# Patient Record
Sex: Male | Born: 1940 | Race: White | Hispanic: No | Marital: Married | Smoking: Never smoker
Health system: Southern US, Community
[De-identification: ages and names within clinical notes are randomized; demographics above are authoritative.]

## PROBLEM LIST (undated history)

## (undated) DIAGNOSIS — G4733 Obstructive sleep apnea (adult) (pediatric): Secondary | ICD-10-CM

## (undated) DIAGNOSIS — I10 Essential (primary) hypertension: Secondary | ICD-10-CM

## (undated) DIAGNOSIS — E119 Type 2 diabetes mellitus without complications: Secondary | ICD-10-CM

## (undated) DIAGNOSIS — R011 Cardiac murmur, unspecified: Secondary | ICD-10-CM

## (undated) DIAGNOSIS — I639 Cerebral infarction, unspecified: Secondary | ICD-10-CM

## (undated) HISTORY — DX: Obstructive sleep apnea (adult) (pediatric): G47.33

## (undated) HISTORY — DX: Cerebral infarction, unspecified: I63.9

## (undated) HISTORY — DX: Essential (primary) hypertension: I10

## (undated) HISTORY — DX: Cardiac murmur, unspecified: R01.1

## (undated) HISTORY — DX: Type 2 diabetes mellitus without complications: E11.9

---

## 2004-01-15 LAB — HM COLONOSCOPY: HM Colonoscopy: NORMAL

## 2006-09-16 ENCOUNTER — Encounter: Payer: Self-pay | Admitting: Family Medicine

## 2006-09-16 LAB — CONVERTED CEMR LAB
Cholesterol: 162 mg/dL
LDL Cholesterol: 107 mg/dL
Triglycerides: 66 mg/dL

## 2007-03-23 ENCOUNTER — Ambulatory Visit: Payer: Self-pay | Admitting: Family Medicine

## 2007-03-23 DIAGNOSIS — I10 Essential (primary) hypertension: Secondary | ICD-10-CM

## 2007-03-23 DIAGNOSIS — E785 Hyperlipidemia, unspecified: Secondary | ICD-10-CM | POA: Insufficient documentation

## 2007-03-23 DIAGNOSIS — I6350 Cerebral infarction due to unspecified occlusion or stenosis of unspecified cerebral artery: Secondary | ICD-10-CM | POA: Insufficient documentation

## 2007-03-26 ENCOUNTER — Encounter: Payer: Self-pay | Admitting: Family Medicine

## 2007-03-30 ENCOUNTER — Encounter: Payer: Self-pay | Admitting: Family Medicine

## 2007-03-30 LAB — CONVERTED CEMR LAB
Albumin: 4.4 g/dL (ref 3.5–5.2)
Alkaline Phosphatase: 59 units/L (ref 39–117)
Calcium: 9.2 mg/dL (ref 8.4–10.5)
Cholesterol, target level: 200 mg/dL
Creatinine, Ser: 1.02 mg/dL (ref 0.40–1.50)
Glucose, Bld: 106 mg/dL — ABNORMAL HIGH (ref 70–99)
HDL goal, serum: 40 mg/dL
HDL: 41 mg/dL (ref >39)
PSA: 3.26 ng/mL (ref 0.10–4.00)
Sodium: 140 meq/L (ref 135–145)
Total Protein: 6.9 g/dL (ref 6.0–8.3)
VLDL: 14 mg/dL (ref 0–40)

## 2007-04-07 ENCOUNTER — Telehealth: Payer: Self-pay | Admitting: Family Medicine

## 2007-04-23 ENCOUNTER — Telehealth: Payer: Self-pay | Admitting: Family Medicine

## 2007-04-27 ENCOUNTER — Encounter: Payer: Self-pay | Admitting: Family Medicine

## 2007-04-27 DIAGNOSIS — G4733 Obstructive sleep apnea (adult) (pediatric): Secondary | ICD-10-CM

## 2007-04-27 DIAGNOSIS — E119 Type 2 diabetes mellitus without complications: Secondary | ICD-10-CM | POA: Insufficient documentation

## 2007-04-28 ENCOUNTER — Ambulatory Visit: Payer: Self-pay | Admitting: Family Medicine

## 2007-05-14 ENCOUNTER — Encounter: Payer: Self-pay | Admitting: Family Medicine

## 2007-06-26 ENCOUNTER — Ambulatory Visit: Payer: Self-pay | Admitting: Family Medicine

## 2007-07-20 ENCOUNTER — Telehealth: Payer: Self-pay | Admitting: Family Medicine

## 2007-09-24 ENCOUNTER — Telehealth: Payer: Self-pay | Admitting: Family Medicine

## 2007-09-28 ENCOUNTER — Encounter: Payer: Self-pay | Admitting: Family Medicine

## 2007-09-29 LAB — CONVERTED CEMR LAB
Alkaline Phosphatase: 50 units/L (ref 39–117)
BUN: 17 mg/dL (ref 6–23)
CO2: 26 meq/L (ref 19–32)
Calcium: 10 mg/dL (ref 8.4–10.5)
Cholesterol: 108 mg/dL (ref 0–200)
Glucose, Bld: 115 mg/dL — ABNORMAL HIGH (ref 70–99)
HDL: 39 mg/dL — ABNORMAL LOW (ref >39)
LDL Cholesterol: 49 mg/dL (ref 0–99)
Potassium: 4.5 meq/L (ref 3.5–5.3)
Total CHOL/HDL Ratio: 2.8

## 2007-10-28 ENCOUNTER — Ambulatory Visit: Payer: Self-pay | Admitting: Family Medicine

## 2007-10-28 DIAGNOSIS — L909 Atrophic disorder of skin, unspecified: Secondary | ICD-10-CM | POA: Insufficient documentation

## 2007-10-28 DIAGNOSIS — L919 Hypertrophic disorder of the skin, unspecified: Secondary | ICD-10-CM

## 2007-10-28 DIAGNOSIS — R350 Frequency of micturition: Secondary | ICD-10-CM

## 2007-10-29 ENCOUNTER — Telehealth: Payer: Self-pay | Admitting: Family Medicine

## 2008-04-07 ENCOUNTER — Encounter: Payer: Self-pay | Admitting: Family Medicine

## 2008-04-11 LAB — CONVERTED CEMR LAB
ALT: 13 units/L (ref 0–53)
AST: 14 units/L (ref 0–37)
Albumin: 4.3 g/dL (ref 3.5–5.2)
BUN: 17 mg/dL (ref 6–23)
Cholesterol: 97 mg/dL (ref 0–200)
Creatinine, Ser: 1.05 mg/dL (ref 0.40–1.50)
Glucose, Bld: 106 mg/dL — ABNORMAL HIGH (ref 70–99)
LDL Cholesterol: 42 mg/dL (ref 0–99)
Potassium: 4.8 meq/L (ref 3.5–5.3)
Total CHOL/HDL Ratio: 2.4
Total Protein: 7 g/dL (ref 6.0–8.3)
VLDL: 14 mg/dL (ref 0–40)

## 2008-04-12 ENCOUNTER — Ambulatory Visit: Payer: Self-pay | Admitting: Family Medicine

## 2008-05-06 ENCOUNTER — Telehealth (INDEPENDENT_AMBULATORY_CARE_PROVIDER_SITE_OTHER): Payer: Self-pay | Admitting: *Deleted

## 2008-06-01 ENCOUNTER — Ambulatory Visit: Payer: Self-pay | Admitting: Family Medicine

## 2008-06-01 LAB — CONVERTED CEMR LAB: Blood Glucose, AC Bkfst: 125 mg/dL

## 2008-06-06 ENCOUNTER — Telehealth: Payer: Self-pay | Admitting: Family Medicine

## 2008-08-01 ENCOUNTER — Ambulatory Visit: Payer: Self-pay | Admitting: Family Medicine

## 2008-09-12 ENCOUNTER — Ambulatory Visit: Payer: Self-pay | Admitting: Family Medicine

## 2008-09-22 ENCOUNTER — Encounter: Payer: Self-pay | Admitting: Family Medicine

## 2008-11-25 ENCOUNTER — Ambulatory Visit: Payer: Self-pay | Admitting: Family Medicine

## 2008-12-21 ENCOUNTER — Ambulatory Visit: Payer: Self-pay | Admitting: Family Medicine

## 2008-12-21 LAB — CONVERTED CEMR LAB
Creatinine,U: 300 mg/dL
Microalbumin U total vol: 30 mg/L

## 2008-12-28 ENCOUNTER — Encounter: Payer: Self-pay | Admitting: Family Medicine

## 2009-04-21 ENCOUNTER — Encounter: Admission: RE | Admit: 2009-04-21 | Discharge: 2009-04-21 | Payer: Self-pay | Admitting: Family Medicine

## 2009-04-21 ENCOUNTER — Ambulatory Visit: Payer: Self-pay | Admitting: Family Medicine

## 2009-04-21 DIAGNOSIS — M25579 Pain in unspecified ankle and joints of unspecified foot: Secondary | ICD-10-CM

## 2009-04-21 LAB — CONVERTED CEMR LAB
Creatinine,U: 100 mg/dL
Hgb A1c MFr Bld: 5.8 %
Microalbumin U total vol: 10 mg/L

## 2009-04-25 ENCOUNTER — Encounter: Payer: Self-pay | Admitting: Family Medicine

## 2009-04-25 LAB — CONVERTED CEMR LAB
Albumin: 4.4 g/dL (ref 3.5–5.2)
CO2: 27 meq/L (ref 19–32)
Chloride: 104 meq/L (ref 96–112)
Glucose, Bld: 111 mg/dL — ABNORMAL HIGH (ref 70–99)
LDL Cholesterol: 54 mg/dL (ref 0–99)
PSA: 3.31 ng/mL (ref 0.10–4.00)
Potassium: 5 meq/L (ref 3.5–5.3)
Total CHOL/HDL Ratio: 2.5
VLDL: 12 mg/dL (ref 0–40)

## 2009-05-22 ENCOUNTER — Ambulatory Visit: Payer: Self-pay | Admitting: Family Medicine

## 2009-06-23 ENCOUNTER — Ambulatory Visit: Payer: Self-pay | Admitting: Family Medicine

## 2009-08-31 ENCOUNTER — Ambulatory Visit: Payer: Self-pay | Admitting: Family Medicine

## 2009-10-01 LAB — HM DIABETES FOOT EXAM

## 2010-01-01 ENCOUNTER — Ambulatory Visit: Payer: Self-pay | Admitting: Family Medicine

## 2010-01-01 ENCOUNTER — Encounter: Payer: Self-pay | Admitting: Family Medicine

## 2010-01-01 ENCOUNTER — Telehealth: Payer: Self-pay | Admitting: Family Medicine

## 2010-01-01 LAB — CONVERTED CEMR LAB: Hgb A1c MFr Bld: 5.6 %

## 2010-01-02 LAB — CONVERTED CEMR LAB
BUN: 18 mg/dL (ref 6–23)
CO2: 27 meq/L (ref 19–32)
Chloride: 105 meq/L (ref 96–112)
Creatinine, Ser: 1.13 mg/dL (ref 0.40–1.50)
Glucose, Bld: 100 mg/dL — ABNORMAL HIGH (ref 70–99)
Potassium: 4.3 meq/L (ref 3.5–5.3)

## 2010-02-13 NOTE — Assessment & Plan Note (Signed)
Summary: f/u BP   Vital Signs:  Patient profile:   70 year old male Height:      67.1 inches Weight:      175 pounds BMI:     27.43 O2 Sat:      98 % on Room air Pulse rate:   67 / minute BP sitting:   127 / 71  (left arm) Cuff size:   regular  Vitals Entered By: Payton Spark CMA (June 23, 2009 10:18 AM)  O2 Flow:  Room air CC: F/U HTN   Primary Care Provider:  Nani Gasser MD  CC:  F/U HTN.  History of Present Illness: 70 yo WM presents for HTN f/u.  Saw Dr Linford Arnold 1 mo ago and BP meds changed to [employer]. Tolerating it well.  Labs done in April.  Denies CP, leg edema, SOB or palpitations.    Allergies: No Known Drug Allergies  Past History:  Past Medical History: Stroke in 2002 Heart Murmur w/ LVH OSA not using CPAP HTN  Past Surgical History: Reviewed history from 03/23/2007 and no changes required. None  Social History: Reviewed history from 04/28/2007 and no changes required. 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 Denies any long lasting effects from his stroke.  HS degree. married to [employer].  One daughter.  Retired Alcohol use-no Drug use-no Regular exercise-no Never Smoked  Review of Systems      See HPI  Physical Exam  General:  alert, well-developed, well-nourished, and well-hydrated.   Mouth:  pharynx pink and moist.   Neck:  no masses.   Lungs:  CTA bilat w/o W/R/R Heart:  RRR with 2/6 SEM over apex Extremities:  trace LE pitting edema bilat Skin:  color normal.     Impression & Recommendations:  Problem # 1:  ESSENTIAL HYPERTENSION, BENIGN (ICD-401.1) BP much improved. Continue Tribenzor.  1 mo of samples given + RX thru [facility]. F/U with Dr Eppie Gibson in 2 mos for DM and HTN. His updated medication list for this problem includes:    Tribenzor 40-5-25 Mg Tabs (Olmesartan-amlodipine-hctz) .Marland Kitchen... Take 1 tablet by mouth once a day in the am  BP today: 127/71 Prior BP: 149/73 (05/22/2009)  Prior 10 Yr Risk Heart Disease: 22 %  (04/21/2009)  Labs Reviewed: K+: 5.0 (04/25/2009) Creat: : 1.13 (04/25/2009)   Chol: 110 (04/25/2009)   HDL: 44 (04/25/2009)   LDL: 54 (04/25/2009)   TG: 60 (04/25/2009)  Complete Medication List: 1)  Simvastatin 80 Mg Tabs (Simvastatin) .... Take 1 tablet by mouth once a day at bedtime 2)  Aspirin 81 Mg Tbec (Aspirin) .... Take 1 tablet by mouth once a day 3)  Lumigan 0.03 % Soln (Bimatoprost) .... One drop to both eyes daily 4)  Brimonidine Tartrate 0.2 % Soln (Brimonidine tartrate) 5)  Metformin Hcl 500 Mg Tabs (Metformin hcl) .... Take 1 tablet by mouth once a day in the am. 6)  Glucometer  .... With 100 strips and lancets. 7)  Tribenzor 40-5-25 Mg Tabs (Olmesartan-amlodipine-hctz) .... Take 1 tablet by mouth once a day in the am  Patient Instructions: 1)  Stay on Tribenzor. 2)  BP looks great! 3)  RX was sent to [facility]. 4)  Return to see Dr Linford Arnold in 2 mos for BP and Diabetes. Prescriptions: TRIBENZOR 40-5-25 MG TABS (OLMESARTAN-AMLODIPINE-HCTZ) Take 1 tablet by mouth once a day in the AM  #90 x 1   Entered and Authorized by:   Seymour Bars DO   Signed by:   Seymour Bars DO  on 06/23/2009   Method used:   Electronically to        [facility]* (mail-order)             ,          Ph: [phone]       Fax: [phone]   RxID:   [id number]

## 2010-02-13 NOTE — Assessment & Plan Note (Signed)
Summary: 1 mo. f/u HTN   Vital Signs:  Patient profile:   70 year old male Height:      67.1 inches Weight:      177 pounds Pulse rate:   59 / minute BP sitting:   149 / 73  (left arm) Cuff size:   regular  Vitals Entered By: Kathlene November (May 22, 2009 10:23 AM) CC: recheck BP, Hypertension Management   Primary Care Provider:  Linford Arnold, C  CC:  recheck BP and Hypertension Management.  History of Present Illness: recheck BP, Hypertension Management  Hypertension History:      He denies headache, chest pain, palpitations, dyspnea with exertion, orthopnea, PND, peripheral edema, visual symptoms, neurologic problems, syncope, and side effects from treatment.  He notes no problems with any antihypertensive medication side effects.  Says he is taking a whole pill.  Marland Kitchen        Positive major cardiovascular risk factors include male age 37 years old or older, diabetes, hyperlipidemia, and hypertension.  Negative major cardiovascular risk factors include negative family history for ischemic heart disease and non-tobacco-user status.        Positive history for target organ damage include prior stroke (or TIA).  Further assessment for target organ damage reveals no history of ASHD or peripheral vascular disease.      Current Medications (verified): 1)  Lisinopril 40 Mg  Tabs (Lisinopril) .... Take 1 Tablet By Mouth Once A Day 2)  Simvastatin 80 Mg  Tabs (Simvastatin) .... Take 1 Tablet By Mouth Once A Day At Bedtime 3)  Aspirin 81 Mg  Tbec (Aspirin) .... Take 1 Tablet By Mouth Once A Day 4)  Lumigan 0.03 %  Soln (Bimatoprost) .... One Drop To Both Eyes Daily 5)  Hydrochlorothiazide 25 Mg Tabs (Hydrochlorothiazide) .... Take 1 Tablet By Mouth Once A Day 6)  Brimonidine Tartrate 0.2 %  Soln (Brimonidine Tartrate) 7)  Metformin Hcl 500 Mg Tabs (Metformin Hcl) .... Take 1 Tablet By Mouth Once A Day in The Am. 8)  Glucometer .... With 100 Strips and Lancets.  Allergies (verified): No Known  Drug Allergies  Physical Exam  General:  Well-developed,well-nourished,in no acute distress; alert,appropriate and cooperative throughout examination Head:  Normocephalic and atraumatic without obvious abnormalities. No apparent alopecia or balding. Eyes:  No corneal or conjunctival inflammation noted. EOMI. Perrla.  Lungs:  Normal respiratory effort, chest expands symmetrically. Lungs are clear to auscultation, no crackles or wheezes. Heart:  Normal rate and regular rhythm. S1 and S2 normal without gallop, click, rub or other extra sounds. 2/6 SEM best heard on teh right side of the chest.  Skin:  no rashes.   Cervical Nodes:  No lymphadenopathy noted Psych:  Cognition and judgment appear intact. Alert and cooperative with normal attention span and concentration. No apparent delusions, illusions, hallucinations   Impression & Recommendations:  Problem # 1:  ESSENTIAL HYPERTENSION, BENIGN (ICD-401.1) Still elevated . Change to TGribenzor. If too expensive then will go back to the lisinopril, HCTZ, adn add amlodipine separately.   F/U in one month for BP check.  The following medications were removed from the medication list:    Lisinopril 40 Mg Tabs (Lisinopril) .Marland Kitchen... Take 1 tablet by mouth once a day    Hydrochlorothiazide 25 Mg Tabs (Hydrochlorothiazide) .Marland Kitchen... Take 1 tablet by mouth once a day His updated medication list for this problem includes:    Tribenzor 40-5-25 Mg Tabs (Olmesartan-amlodipine-hctz) .Marland Kitchen... Take 1 tablet by mouth once a day in the  am  Complete Medication List: 1)  Simvastatin 80 Mg Tabs (Simvastatin) .... Take 1 tablet by mouth once a day at bedtime 2)  Aspirin 81 Mg Tbec (Aspirin) .... Take 1 tablet by mouth once a day 3)  Lumigan 0.03 % Soln (Bimatoprost) .... One drop to both eyes daily 4)  Brimonidine Tartrate 0.2 % Soln (Brimonidine tartrate) 5)  Metformin Hcl 500 Mg Tabs (Metformin hcl) .... Take 1 tablet by mouth once a day in the am. 6)  Glucometer  ....  With 100 strips and lancets. 7)  Tribenzor 40-5-25 Mg Tabs (Olmesartan-amlodipine-hctz) .... Take 1 tablet by mouth once a day in the am  Hypertension Assessment/Plan:      The patient's hypertensive risk group is category C: Target organ damage and/or diabetes.  His calculated 10 year risk of coronary heart disease is 22 %.  Today's blood pressure is 149/73.  His blood pressure goal is < 140/90.  Patient Instructions: 1)  Will try Tribenzor once a day (in place of your lisinopril and the HCTZ).  If too expensive then let me know and will restart the lisinopril, HCTZ and will add amlodipine.   2)  Please schedule a follow-up appointment in 1 month to recheck your blood pressure.  Prescriptions: TRIBENZOR 40-5-25 MG TABS (OLMESARTAN-AMLODIPINE-HCTZ) Take 1 tablet by mouth once a day in the AM  #30 x 0   Entered and Authorized by:   Nani Gasser MD   Signed by:   Nani Gasser MD on 05/22/2009   Method used:   Electronically to        [facility] [phone]* (retail)       [address] [place], [place]  [address]       Ph: [phone]       Fax: [phone]   RxID:   [id number] SIMVASTATIN 80 MG  TABS (SIMVASTATIN) Take 1 tablet by mouth once a day at bedtime  #90 x 3   Entered by:   Kathlene November   Authorized by:   Nani Gasser MD   Signed by:   Nani Gasser MD on 05/22/2009   Method used:   Electronically to        [facility]* (mail-order)             ,          Ph: [phone]       Fax: [phone]   RxID:   [id number]   [employer] Next Due:  Not Indicated Hemoccult Next Due:  Not Indicated

## 2010-02-13 NOTE — Assessment & Plan Note (Signed)
Summary: 2 MONTH FU//VGJ   Vital Signs:  Patient profile:   70 year old male Height:      67.1 inches Weight:      178 pounds BMI:     27.90 O2 Sat:      99 % on Room air Pulse rate:   60 / minute BP sitting:   124 / 68  (left arm) Cuff size:   regular  Vitals Entered By: Payton Spark CMA (August 31, 2009 10:04 AM)  O2 Flow:  Room air CC: F/U   Primary Care Shetara Launer:  Nani Gasser MD  CC:  F/U.  History of Present Illness: 70 yo WM presents for f/u HTN, DM and memory testing s/p CVA.  He is well controlled with his DM.  Home AM fastings are in the 90s.  Fair control with diet, no exercise.  Eye exam, monofilament and microlabumin are UTD.  He has cutting his metformin in half.    His HTN has been well controlled on Tribenzor.  he has also been cutting this in half on his own.  Denies CP or DOE or leg swelling.    His wife was concerned about memory loss, so he returned for an updated memmory test today.  Allergies: No Known Drug Allergies  Past History:  Past Medical History: Stroke in 2002 Heart Murmur w/ LVH OSA not using CPAP HTN T2DM - well controlled  Social History: Reviewed history from 04/28/2007 and no changes required. 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 Denies any long lasting effects from his stroke.  HS degree. married to [employer].  One daughter.  Retired Alcohol use-no Drug use-no Regular exercise-no Never Smoked  Review of Systems      See HPI  Physical Exam  General:  alert, well-developed, and well-nourished.   Head:  normocephalic and atraumatic.   Eyes:  pupils equal, pupils round, and pupils reactive to light.   Mouth:  pharynx pink and moist.   Neck:  supple, no thyromegaly, and no carotid bruits.   Lungs:  Clear to auscultation bilaterally with no wheezes, crackles or rhonchi. Heart:  normal rate, regular rhythm, no murmur, no gallop, and no rub.   Extremities:  no LE edema Neurologic:  DTRs symmetrical and normal.     Skin:  color normal.   Cervical Nodes:  no anterior cervical adenopathy and no posterior cervical adenopathy.   Psych:  Oriented X3, good eye contact, and not anxious appearing.   Additional Exam:  Today, we performed a cognitive exam because of concern about memory.  On the Mini-Mental State Exam, he scored a 28 out of 30 with misses on copying designs and the numerical date.  Registration and recall of words were grossly intact.  He completed parts A and B the trails making test with times of 52 seconds and 83 seconds respectivelly both are which are well within normal limits.  He scored a 8 out of ten on the clock draw test and had some difficulty placing the numbers in their correct position on the clock.  This along with copying designs shows some evidence of difficulty of visuo-spaital recognition.    Diabetes Management Exam:    Foot Exam (with socks and/or shoes not present):       Sensory-Pinprick/Light touch:          Left medial foot (L-4): normal          Left dorsal foot (L-5): normal          Left  lateral foot (S-1): normal          Right medial foot (L-4): normal          Right dorsal foot (L-5): normal          Right lateral foot (S-1): normal       Sensory-Monofilament:          Left foot: normal          Right foot: normal       Inspection:          Left foot: normal          Right foot: normal   Impression & Recommendations:  Problem # 1:  DIABETES MELLITUS, TYPE II (ICD-250.00) Urine micro, eye exam, monofilament are UTD. A1C today:5.3 excellent.   Continue current meds.  Home sugars at goal : 90s fasting. RTC in 3 mos for f/u DM/ flu shot. His updated medication list for this problem includes:    Aspirin 81 Mg Tbec (Aspirin) .Marland Kitchen... Take 1 tablet by mouth once a day    Metformin Hcl 500 Mg Tabs (Metformin hcl) .Marland Kitchen... 1/2 tab by mouth daily    Tribenzor 40-5-25 Mg Tabs (Olmesartan-amlodipine-hctz) .Marland Kitchen... 1/2 tab by mouth once daily  Labs Reviewed: Creat: 1.13  (04/25/2009)   Microalbumin: 10 (04/21/2009) Reviewed HgBA1c results: 5.8 (04/21/2009)  5.3 (12/21/2008)  Problem # 2:  CVA (ICD-434.91) Memory testing done today. 28/30 MMSE. His updated medication list for this problem includes:    Aspirin 81 Mg Tbec (Aspirin) .Marland Kitchen... Take 1 tablet by mouth once a day  Problem # 3:  ESSENTIAL HYPERTENSION, BENIGN (ICD-401.1) BP looks great.  He's been cutting his Tribenzor in half.  Will continue.   His updated medication list for this problem includes:    Tribenzor 40-5-25 Mg Tabs (Olmesartan-amlodipine-hctz) .Marland Kitchen... 1/2 tab by mouth once daily  BP today: 124/68 Prior BP: 127/71 (06/23/2009)  Prior 10 Yr Risk Heart Disease: 22 % (04/21/2009)  Labs Reviewed: K+: 5.0 (04/25/2009) Creat: : 1.13 (04/25/2009)   Chol: 110 (04/25/2009)   HDL: 44 (04/25/2009)   LDL: 54 (04/25/2009)   TG: 60 (04/25/2009)  Problem # 4:  HYPERLIPIDEMIA (ICD-272.4) Labs done 4-11; at goal.  Continue current meds. His updated medication list for this problem includes:    Simvastatin 80 Mg Tabs (Simvastatin) .Marland Kitchen... 1/2 tab by mouth qhs  Labs Reviewed: SGOT: 14 (04/25/2009)   SGPT: 12 (04/25/2009)  Lipid Goals: Chol Goal: 200 (03/30/2007)   HDL Goal: 40 (03/30/2007)   LDL Goal: 100 (03/30/2007)   TG Goal: 150 (03/30/2007)  Prior 10 Yr Risk Heart Disease: 22 % (04/21/2009)   HDL:44 (04/25/2009), 41 (04/07/2008)  LDL:54 (04/25/2009), 42 (04/07/2008)  Chol:110 (04/25/2009), 97 (04/07/2008)  Trig:60 (04/25/2009), 68 (04/07/2008)  Complete Medication List: 1)  Simvastatin 80 Mg Tabs (Simvastatin) .... 1/2 tab by mouth qhs 2)  Aspirin 81 Mg Tbec (Aspirin) .... Take 1 tablet by mouth once a day 3)  Lumigan 0.03 % Soln (Bimatoprost) .... One drop to both eyes daily 4)  Brimonidine Tartrate 0.2 % Soln (Brimonidine tartrate) 5)  Metformin Hcl 500 Mg Tabs (Metformin hcl) .... 1/2 tab by mouth daily 6)  Glucometer  .... With 100 strips and lancets. 7)  Tribenzor 40-5-25 Mg Tabs  (Olmesartan-amlodipine-hctz) .... 1/2 tab by mouth once daily  Patient Instructions: 1)  REturn for a f/u with Dr Linford Arnold in 3 mos.  Will do flu shot at this visit.  Appended Document: 2 MONTH FU//VGJ  Laboratory Results  Blood Tests     HGBA1C: 5.3%   (Normal Range: Non-Diabetic - 3-6%   Control Diabetic - 6-8%)

## 2010-02-13 NOTE — Letter (Signed)
Summary: MMSE Form  MMSE Form   Imported By: Lanelle Bal 09/05/2009 10:51:27  _____________________________________________________________________  External Attachment:    Type:   Image     Comment:   External Document

## 2010-02-13 NOTE — Assessment & Plan Note (Signed)
Summary: 4 MONTH FUP HTN, left ankle pain   Vital Signs:  Patient profile:   70 year old male Height:      67.1 inches Weight:      176 pounds BMI:     27.58 Pulse rate:   61 / minute BP sitting:   148 / 72  (left arm) Cuff size:   regular  Vitals Entered By: Kathlene November (April 21, 2009 9:53 AM) CC: followup BP and diabetes, Hypertension Management Is Patient Diabetic? Yes   Primary Care Provider:  Linford Arnold, C  CC:  followup BP and diabetes and Hypertension Management.  History of Present Illness: followup BP and diabetes.  Left ankle pain, outside.  Feels it is weak.  No aggrevating or alleviating pain.  Bothers him daily. No swelling. No old injuries.  No giving or out locking. No hx of gout. Never takes any pain meds for it.  He is very nondescript about the pain.   Hypertension History:      He denies headache, chest pain, palpitations, dyspnea with exertion, orthopnea, PND, peripheral edema, visual symptoms, neurologic problems, syncope, and side effects from treatment.  He notes no problems with any antihypertensive medication side effects.  Has been taking half of .        Positive major cardiovascular risk factors include male age 60 years old or older, diabetes, hyperlipidemia, and hypertension.  Negative major cardiovascular risk factors include negative family history for ischemic heart disease and non-tobacco-user status.        Positive history for target organ damage include prior stroke (or TIA).  Further assessment for target organ damage reveals no history of ASHD or peripheral vascular disease.     Current Medications (verified): 1)  Lisinopril 40 Mg  Tabs (Lisinopril) .... Take 1 Tablet By Mouth Once A Day 2)  Simvastatin 80 Mg  Tabs (Simvastatin) .... Take 1 Tablet By Mouth Once A Day At Bedtime 3)  Aspirin 81 Mg  Tbec (Aspirin) .... Take 1 Tablet By Mouth Once A Day 4)  Lumigan 0.03 %  Soln (Bimatoprost) .... One Drop To Both Eyes Daily 5)  Hydrochlorothiazide  25 Mg Tabs (Hydrochlorothiazide) .... Take 1 Tablet By Mouth Once A Day 6)  Brimonidine Tartrate 0.2 %  Soln (Brimonidine Tartrate) 7)  Metformin Hcl 500 Mg Tabs (Metformin Hcl) .... Take 1 Tablet By Mouth Once A Day in The Am. 8)  Glucometer .... With 100 Strips and Lancets.  Allergies (verified): No Known Drug Allergies  Comments:  Nurse/Medical Assistant: The patient's medications and allergies were reviewed with the patient and were updated in the Medication and Allergy Lists. Kathlene November (April 21, 2009 9:54 AM)  Physical Exam  General:  Well-developed,well-nourished,in no acute distress; alert,appropriate and cooperative throughout examination Head:  Normocephalic and atraumatic without obvious abnormalities. No apparent alopecia or balding. Neck:  No deformities, masses, or tenderness noted. Lungs:  Normal respiratory effort, chest expands symmetrically. Lungs are clear to auscultation, no crackles or wheezes. Heart:  Normal rate and regular rhythm. S1 and S2 normal without gallop, murmur, click, rub or other extra sounds. No carotid brutis.  Msk:  Left ankel with no swelling or tenderness. No abnomralities. Some of the nails are slightly thickened.  Nrom and strength are 5/5 bilat.  Great toe strength 5/5/  Pulses:  DP and Post Tib are 2+.  Cap refill in teh great toe is normal.  Skin:  no rashes.   Cervical Nodes:  No lymphadenopathy noted Psych:  Cognition and judgment appear intact. Alert and cooperative with normal attention span and concentration. No apparent delusions, illusions, hallucinations   Impression & Recommendations:  Problem # 1:  DIABETES MELLITUS, TYPE II (ICD-250.00) Did go to the diabetes classes but his wife says he is not following the diet.  Doing well on the metformin. Continue current regimen.  His updated medication list for this problem includes:    Lisinopril 40 Mg Tabs (Lisinopril) .Marland Kitchen... Take 1 tablet by mouth once a day    Aspirin 81 Mg Tbec  (Aspirin) .Marland Kitchen... Take 1 tablet by mouth once a day    Metformin Hcl 500 Mg Tabs (Metformin hcl) .Marland Kitchen... Take 1 tablet by mouth once a day in the am.  Orders: Creatinine  ([id number]) Hgb A1C ([id number]) Urine Microalbumin (38756) T-Comprehensive Metabolic Panel ([id number]) T-Lipid Profile ([id number])  Problem # 2:  ESSENTIAL HYPERTENSION, BENIGN (ICD-401.1) Dsicussed importance of  taking the medications as prescribed. Needs to take a whol pill. Explained that he is at increased risk of recurrent stroke if hi BP is not well controlled.  His updated medication list for this problem includes:    Lisinopril 40 Mg Tabs (Lisinopril) .Marland Kitchen... Take 1 tablet by mouth once a day    Hydrochlorothiazide 25 Mg Tabs (Hydrochlorothiazide) .Marland Kitchen... Take 1 tablet by mouth once a day  BP today: 148/72 Prior BP: 147/73 (12/21/2008)  Prior 10 Yr Risk Heart Disease: 14 % (10/28/2007)  Labs Reviewed: K+: 4.8 (04/07/2008) Creat: : 1.05 (04/07/2008)   Chol: 97 (04/07/2008)   HDL: 41 (04/07/2008)   LDL: 42 (04/07/2008)   TG: 68 (04/07/2008)  Problem # 3:  ANKLE PAIN, LEFT (ICD-719.47) Exam is completley normal. Will get an xray to rule out fracture or arthritis.  Pain has persisted for 2 months. Can use an NSAID or Tyelnol for pain relief if needed but he says he won't really take anything for it.  Call if gets worse.   Orders: T-DG Ankle 2 Views *L* ([id number])  Complete Medication List: 1)  Lisinopril 40 Mg Tabs (Lisinopril) .... Take 1 tablet by mouth once a day 2)  Simvastatin 80 Mg Tabs (Simvastatin) .... Take 1 tablet by mouth once a day at bedtime 3)  Aspirin 81 Mg Tbec (Aspirin) .... Take 1 tablet by mouth once a day 4)  Lumigan 0.03 % Soln (Bimatoprost) .... One drop to both eyes daily 5)  Hydrochlorothiazide 25 Mg Tabs (Hydrochlorothiazide) .... Take 1 tablet by mouth once a day 6)  Brimonidine Tartrate 0.2 % Soln (Brimonidine tartrate) 7)  Metformin Hcl 500 Mg Tabs (Metformin hcl) .... Take 1 tablet by mouth  once a day in the am. 8)  Glucometer  .... With 100 strips and lancets.  Other Orders: T-PSA ([id number])  Hypertension Assessment/Plan:      The patient's hypertensive risk group is category C: Target organ damage and/or diabetes.  His calculated 10 year risk of coronary heart disease is 22 %.  Today's blood pressure is 148/72.  His blood pressure goal is < 140/90.  Patient Instructions: 1)  Please schedule a follow-up appointment in 1 month for blood pressure.  Prescriptions: METFORMIN HCL 500 MG TABS (METFORMIN HCL) Take 1 tablet by mouth once a day in the AM.  #90 x 3   Entered and Authorized by:   Nani Gasser MD   Signed by:   Nani Gasser MD on 04/21/2009   Method used:   Electronically to        [facility]* (mail-order)             ,  Ph: [phone]       Fax: [phone]   RxID:   [id number] HYDROCHLOROTHIAZIDE 25 MG TABS (HYDROCHLOROTHIAZIDE) Take 1 tablet by mouth once a day  #90 x 3   Entered and Authorized by:   Nani Gasser MD   Signed by:   Nani Gasser MD on 04/21/2009   Method used:   Electronically to        [facility]* (mail-order)             ,          Ph: [phone]       Fax: [phone]   RxID:   [id number]   Laboratory Results   Urine Tests  Date/Time Received: 04/21/2009 Date/Time Reported: 04/21/2009  Microalbumin (urine): 10 mg/L Creatinine: 100mg /dL  A:C Ratio 30mg /g  Blood Tests   Date/Time Received: 04/21/2009 Date/Time Reported: 04/21/2009  HGBA1C: 5.8%   (Normal Range: Non-Diabetic - 3-6%   Control Diabetic - 6-8%)

## 2010-02-15 NOTE — Letter (Signed)
Summary: [facility]   Imported By: Lanelle Bal 01/22/2010 11:16:57  _____________________________________________________________________  External Attachment:    Type:   Image     Comment:   External Document

## 2010-02-15 NOTE — Assessment & Plan Note (Signed)
Summary: F/U DM, HTN   Vital Signs:  Patient profile:   70 year old male Height:      67.1 inches Weight:      184 pounds BMI:     28.84 Pulse rate:   66 / minute BP sitting:   156 / 74  (right arm) Cuff size:   regular  Vitals Entered By: Avon Gully CMA, Duncan Dull) (January 01, 2010 10:15 AM) CC: f/u DM,gave handout about shingles vaccine   Primary Care Provider:  Nani Gasser MD  CC:  f/u DM and gave handout about shingles vaccine.  History of Present Illness: f/u DM,gave handout about shingles .    Can't afford the Tribenzor. atypicals $200 per prescription.  He is already called and his mail-order company and gotten a new prescription for heart or thiazide.  He needs a new prescription for the ACE inhibitor and amlodipine.  He says he does have enough try been sore for about one more month.  No chest pain or shortness of breath.  He is also here to follow-up his diabetes.  His sugars have mostly been running in the 80s and he checks them.  He denies any lows.   Diabetes Management History:      The patient is a 70 years old male who comes in for evaluation of DM Type 2.  He states understanding of dietary principles and is following his diet appropriately.  He says that he is exercising.  Type of exercise includes: walking.  Duration of exercise is estimated to be 15 min.  He is doing this 4 times per week.        Hypoglycemic symptoms are not occurring.  No hyperglycemic symptoms are reported.  Other comments include: Sugars have been well  controlled. Has gained about 6 lbs. .        There are no symptoms to suggest diabetic complications.  No changes have been made to his treatment plan since last visit.    Current Medications (verified): 1)  Simvastatin 80 Mg  Tabs (Simvastatin) .... 1/2 Tab By Mouth Qhs 2)  Aspirin 81 Mg  Tbec (Aspirin) .... Take 1 Tablet By Mouth Once A Day 3)  Lumigan 0.03 %  Soln (Bimatoprost) .... One Drop To Both Eyes Daily 4)  Brimonidine  Tartrate 0.2 %  Soln (Brimonidine Tartrate) 5)  Metformin Hcl 500 Mg Tabs (Metformin Hcl) .... 1/2 Tab By Mouth Daily 6)  Glucometer .... With 100 Strips and Lancets. 7)  Tribenzor 40-5-25 Mg Tabs (Olmesartan-Amlodipine-Hctz) .... 1/2 Tab By Mouth Once Daily  Allergies (verified): No Known Drug Allergies  Comments:  Nurse/Medical Assistant: The patient's medications and allergies were reviewed with the patient and were updated in the Medication and Allergy Lists. Avon Gully CMA, Duncan Dull) (January 01, 2010 10:16 AM)  Physical Exam  General:  Well-developed,well-nourished,in no acute distress; alert,appropriate and cooperative throughout examination Lungs:  Normal respiratory effort, chest expands symmetrically. Lungs are clear to auscultation, no crackles or wheezes. Heart:  Normal rate and regular rhythm. S1 and S2 normal without gallop, murmur, click, rub or other extra sounds. Skin:  no rashes.   Psych:  Cognition and judgment appear intact. Alert and cooperative with normal attention span and concentration. No apparent delusions, illusions, hallucinations   Impression & Recommendations:  Problem # 1:  DIABETES MELLITUS, TYPE II (ICD-250.00) Discussed ne to lose 10 -15 lbs.  regular exercise would improve this.  We can also split out his Tribenzor  for cost reasons. his A1c  looks fantastic today.  I watched him stop his metformin.  Follow-up in 3 months to make sure he is at goal. I do not have an eye exam on file for him.  He says he did get one this year so we will call [facility]  associates. The following medications were removed from the medication list:    Metformin Hcl 500 Mg Tabs (Metformin hcl) .Marland Kitchen... 1/2 tab by mouth daily His updated medication list for this problem includes:    Aspirin 81 Mg Tbec (Aspirin) .Marland Kitchen... Take 1 tablet by mouth once a day    Lisinopril 40 Mg Tabs (Lisinopril) .Marland Kitchen... Take 1 tablet by mouth once a day  Orders: Fingerstick (16109) Hgb A1C  (60454UJ) T-Basic Metabolic Panel (81191-47829)  Problem # 2:  DIABETES MELLITUS, TYPE II (ICD-250.00) Tribenzor  is too expensive.  Change to hydrocortisone, lisinopril, amlodipine. The following medications were removed from the medication list:    Metformin Hcl 500 Mg Tabs (Metformin hcl) .Marland Kitchen... 1/2 tab by mouth daily His updated medication list for this problem includes:    Aspirin 81 Mg Tbec (Aspirin) .Marland Kitchen... Take 1 tablet by mouth once a day    Lisinopril 40 Mg Tabs (Lisinopril) .Marland Kitchen... Take 1 tablet by mouth once a day  Orders: Fingerstick (36416) Hgb A1C (56213YQ) T-Basic Metabolic Panel (65784-69629)  Complete Medication List: 1)  Simvastatin 80 Mg Tabs (Simvastatin) .... 1/2 tab by mouth qhs 2)  Aspirin 81 Mg Tbec (Aspirin) .... Take 1 tablet by mouth once a day 3)  Lumigan 0.03 % Soln (Bimatoprost) .... One drop to both eyes daily 4)  Brimonidine Tartrate 0.2 % Soln (Brimonidine tartrate) 5)  Glucometer  .... With 100 strips and lancets. 6)  Hydrochlorothiazide 25 Mg Tabs (Hydrochlorothiazide) .... Take 1 tablet by mouth once a day 7)  Lisinopril 40 Mg Tabs (Lisinopril) .... Take 1 tablet by mouth once a day 8)  Amlodipine Besylate 5 Mg Tabs (Amlodipine besylate) .... Take 1 tablet by mouth once a day 9)  Zostavax 52841 Unt/0.46ml Solr (Zoster vaccine live) .... Inject one dose im x 1  Other Orders: Influenza Vaccine MCR (32440)  Diabetes Management Assessment/Plan:      The following lipid goals have been established for the patient: Total cholesterol goal of 200; LDL cholesterol goal of 100; HDL cholesterol goal of 40; Triglyceride goal of 150.  His blood pressure goal is < 140/90.    Patient Instructions: 1)  Since can't afford Tribenzoe will need to take the 3 BP pills below. I having any low BPs at home then let me know.  2)  I do recommend hte shingles vaccines but have to get at a local pharmacy as it is covered under your Medicare Part D.  3)  Please schedule a  follow-up appointment in 3 months for Diabetes and Blood pressure.  4)  Lets stop the metformin and see how you do without it.   Prescriptions: ZOSTAVAX 10272 UNT/0.65ML SOLR (ZOSTER VACCINE LIVE) inject one dose IM x 1  #1 x 0   Entered and Authorized by:   Nani Gasser MD   Signed by:   Nani Gasser MD on 01/01/2010   Method used:   Print then Give to Patient   RxID:   [id number] AMLODIPINE BESYLATE 5 MG TABS (AMLODIPINE BESYLATE) Take 1 tablet by mouth once a day  #90 x 1   Entered and Authorized by:   Nani Gasser MD   Signed by:   Nani Gasser MD on  01/01/2010   Method used:   Electronically to        [facility]* (retail)             ,          Ph: [phone]       Fax: [phone]   RxID:   [id number] LISINOPRIL 40 MG TABS (LISINOPRIL) Take 1 tablet by mouth once a day  #90 x 1   Entered and Authorized by:   Nani Gasser MD   Signed by:   Nani Gasser MD on 01/01/2010   Method used:   Electronically to        [facility]* (retail)             ,          Ph: [phone]       Fax: [phone]   RxID:   [id number]    Orders Added: 1)  Fingerstick [36416] 2)  Hgb A1C [83036QW] 3)  Est. Patient Level III [75643] 4)  T-Basic Metabolic Panel [32951-88416] 5)  Influenza Vaccine MCR [00025] 6)  Est. Patient Level III [60630]   Immunizations Administered:  Influenza Vaccine # 1:    Vaccine Type: Fluvax MCR    Site: right deltoid    Mfr: Sanofi Pasteur    Dose: 0.5 ml    Route: IM    Given by: Sue Lush McCrimmon CMA, (AAMA)    Exp. Date: 06/15/2010    Lot #: [id number]    VIS given: 08/08/09 version given January 01, 2010.  Flu Vaccine Consent Questions:    Do you have a history of severe allergic reactions to this vaccine? no    Any prior history of allergic reactions to egg and/or gelatin? no    Do you have a sensitivity to the preservative Thimersol? no    Do you have a past history of Guillan-Barre  Syndrome? no    Do you currently have an acute febrile illness? no    Have you ever had a severe reaction to latex? no    Vaccine information given and explained to patient? no   Immunizations Administered:  Influenza Vaccine # 1:    Vaccine Type: Fluvax MCR    Site: right deltoid    Mfr: Sanofi Pasteur    Dose: 0.5 ml    Route: IM    Given by: Sue Lush McCrimmon CMA, (AAMA)    Exp. Date: 06/15/2010    Lot #: [id number]    VIS given: 08/08/09 version given January 01, 2010.  Laboratory Results   Blood Tests   Date/Time Received: 01/01/10 Date/Time Reported: 01/01/10  HGBA1C: 5.6%   (Normal Range: Non-Diabetic - 3-6%   Control Diabetic - 6-8%)         Appended Document: F/U DM, HTN   Diabetes Management History:      The patient is a 70 years old male who comes in for evaluation of DM Type 2.  He says that he is exercising.  Type of exercise includes: walking.  Duration of exercise is estimated to be 15 min.  He is doing this 4 times per week.    Diabetes Management Exam:    Eye Exam:       Eye Exam done elsewhere          Date: 09/22/2009          Results: normal          Done by: Karen Kitchens   Diabetes Management Assessment/Plan:  The following lipid goals have been established for the patient: Total cholesterol goal of 200; LDL cholesterol goal of 100; HDL cholesterol goal of 40; Triglyceride goal of 150.  His blood pressure goal is < 140/90.

## 2010-02-15 NOTE — Progress Notes (Signed)
Summary: Did not get shingles vaccine- too expensive  Phone Note Call from Patient Call back at Home Phone [phone]   Caller: Patient Call For: Nani Gasser MD Summary of Call: Pt says [facility] was gonna charge him 40.00 dollars for the shot so he did not get it. Initial call taken by: Kathlene November LPN,  January 01, 2010 11:58 AM

## 2010-02-20 ENCOUNTER — Encounter: Payer: Self-pay | Admitting: Family Medicine

## 2010-03-30 ENCOUNTER — Encounter: Payer: Self-pay | Admitting: Family Medicine

## 2010-04-03 ENCOUNTER — Encounter: Payer: Self-pay | Admitting: Family Medicine

## 2010-04-03 ENCOUNTER — Ambulatory Visit (INDEPENDENT_AMBULATORY_CARE_PROVIDER_SITE_OTHER): Payer: Medicare Other | Admitting: Family Medicine

## 2010-04-03 DIAGNOSIS — E119 Type 2 diabetes mellitus without complications: Secondary | ICD-10-CM

## 2010-04-03 DIAGNOSIS — I10 Essential (primary) hypertension: Secondary | ICD-10-CM

## 2010-04-03 LAB — POCT GLYCOSYLATED HEMOGLOBIN (HGB A1C): Hemoglobin A1C: 5.5

## 2010-04-03 NOTE — Progress Notes (Signed)
  Subjective:    Patient ID: Derrick Cowan, male    DOB: 12-Feb-1940, 70 y.o.   MRN: [id number]  Diabetes He presents for his initial diabetic visit. He has type 2 diabetes mellitus. There are no hypoglycemic associated symptoms. Pertinent negatives for hypoglycemia include no headaches. Pertinent negatives for diabetes include no blurred vision, no chest pain, no foot ulcerations, no polydipsia and no visual change. Symptoms are stable. Current diabetic treatment includes diet. He is compliant with treatment all of the time. His weight is stable. He is following a generally healthy diet. He participates in exercise daily (walking). He monitors urine at home 3+ x per week. His breakfast blood glucose range is generally 90-110 mg/dl. Eye exam current: hAS  eye exam sched tomorrow.  Hypertension This is a chronic problem. The problem is controlled. Pertinent negatives include no blurred vision, chest pain, headaches or shortness of breath. Risk factors for coronary artery disease include diabetes mellitus and male gender. Past treatments include ACE inhibitors. Compliance problems include diet.       Review of Systems  Eyes: Negative for blurred vision.  Respiratory: Negative for shortness of breath.   Cardiovascular: Negative for chest pain.  Neurological: Negative for headaches.  Hematological: Negative for polydipsia.       Objective:   Physical Exam  Constitutional: He appears well-developed.  HENT:  Head: Normocephalic.  Neck: Neck supple. No thyromegaly present.  Cardiovascular: Normal rate and regular rhythm.   Murmur heard.      2/6 SEM  Pulmonary/Chest: Effort normal and breath sounds normal.  Lymphadenopathy:    He has no cervical adenopathy.  Psychiatric: He has a normal mood and affect.          Assessment & Plan:  !. DM - A1C at goal today. Getting eye exam tomorrow. Due for lipids April. He is at goal and is diet controlled. He could really be downgraded to insulin  resistance. 2. HTN- At goal. Looks great today. F/U in 6 months.

## 2010-04-24 ENCOUNTER — Telehealth: Payer: Self-pay | Admitting: Family Medicine

## 2010-04-24 LAB — LIPID PANEL
HDL: 45 mg/dL (ref >39)
LDL Cholesterol: 55 mg/dL (ref 0–99)
Total CHOL/HDL Ratio: 2.5 Ratio

## 2010-04-24 MED ORDER — SIMVASTATIN 20 MG PO TABS: 20.0000 mg | ORAL_TABLET | Freq: Every day | ORAL | Status: DC

## 2010-04-24 NOTE — Telephone Encounter (Signed)
Call pt: Choleserol looks great. It look so goo we can probably drop your simvastatin to 20mg  and then recheck level in 6 month.  When run out of simvastatin don't get refills. Call here and we can send in teh new rx for 20mg  tabs.

## 2010-04-24 NOTE — Telephone Encounter (Signed)
Pt.notified

## 2010-05-14 ENCOUNTER — Telehealth: Payer: Self-pay | Admitting: Family Medicine

## 2010-05-14 DIAGNOSIS — E785 Hyperlipidemia, unspecified: Secondary | ICD-10-CM

## 2010-05-14 MED ORDER — SIMVASTATIN 20 MG PO TABS: 20.0000 mg | ORAL_TABLET | Freq: Every day | ORAL | Status: DC

## 2010-05-14 NOTE — Telephone Encounter (Signed)
Pt called into triage nurse and is requesting simvastatin 80 mg dose to be lowered.  Pt stated provider told will lower the dose.  Wants sent to [facility] for mail order.  Doesn't need supply to local pharm since still has 80 mg dose on hand. PLAN:  Chart reviewed and Simvastatin 20 mg ordered as lowering dose by provider.  Sent new Rx for Simvastatin 20 mg, #90/1 RF sent to [facility]. Jarvis Newcomer, LPN [facility]

## 2010-07-24 ENCOUNTER — Other Ambulatory Visit: Payer: Self-pay | Admitting: Family Medicine

## 2010-08-20 ENCOUNTER — Other Ambulatory Visit: Payer: Self-pay | Admitting: Family Medicine

## 2010-08-29 ENCOUNTER — Encounter: Payer: Self-pay | Admitting: Family Medicine

## 2010-08-29 ENCOUNTER — Ambulatory Visit (INDEPENDENT_AMBULATORY_CARE_PROVIDER_SITE_OTHER): Payer: Medicare Other | Admitting: Family Medicine

## 2010-08-29 DIAGNOSIS — R7309 Other abnormal glucose: Secondary | ICD-10-CM

## 2010-08-29 DIAGNOSIS — I10 Essential (primary) hypertension: Secondary | ICD-10-CM

## 2010-08-29 DIAGNOSIS — E119 Type 2 diabetes mellitus without complications: Secondary | ICD-10-CM

## 2010-08-29 DIAGNOSIS — E785 Hyperlipidemia, unspecified: Secondary | ICD-10-CM

## 2010-08-29 DIAGNOSIS — E8881 Metabolic syndrome: Secondary | ICD-10-CM | POA: Insufficient documentation

## 2010-08-29 NOTE — Assessment & Plan Note (Signed)
BP well controlld. Continue current regimen.

## 2010-08-29 NOTE — Assessment & Plan Note (Signed)
Doing well. No SE. Due to recheck lipids.

## 2010-08-29 NOTE — Assessment & Plan Note (Signed)
Discussed tht his A1C looks great and is in the normal range. Discussed still needs ot eat a healhy diet and needs to get more exercise. He really needs to lose some weight and we discussed this today.

## 2010-08-29 NOTE — Progress Notes (Signed)
  Subjective:    Patient ID: Derrick Cowan, male    DOB: 04-16-1940, 70 y.o.   MRN: [id number]  Hypertension This is a chronic problem. The current episode started more than 1 year ago. Pertinent negatives include no chest pain or shortness of breath. There are no associated agents to hypertension. Past treatments include calcium channel blockers. The current treatment provides moderate improvement. There are no compliance problems.   Hyperlipidemia This is a chronic problem. The problem is controlled. Factors aggravating his hyperlipidemia include no known factors. Pertinent negatives include no chest pain or shortness of breath. Current antihyperlipidemic treatment includes statins (no exercise. ). There are no compliance problems.       Review of Systems  Respiratory: Negative for shortness of breath.   Cardiovascular: Negative for chest pain.       Objective:   Physical Exam  Constitutional: He is oriented to person, place, and time. He appears well-developed and well-nourished.  HENT:  Head: Normocephalic and atraumatic.  Cardiovascular: Normal rate, regular rhythm and normal heart sounds.        No carotid bruits.   Pulmonary/Chest: Effort normal and breath sounds normal.  Musculoskeletal: He exhibits no edema.  Neurological: He is alert and oriented to person, place, and time.  Skin: Skin is warm and dry.  Psychiatric: He has a normal mood and affect.          Assessment & Plan:

## 2010-08-30 ENCOUNTER — Telehealth: Payer: Self-pay | Admitting: Family Medicine

## 2010-08-30 NOTE — Telephone Encounter (Signed)
Pt's wife called and said he was in to be seen yesterday and the provider needed the date of his last colonoscopy. Plan:  The date was called today as follows:  12-19-2003. Routed to Dr. Marlyne Beards, LPN [facility]

## 2010-09-28 ENCOUNTER — Telehealth: Payer: Self-pay | Admitting: Family Medicine

## 2010-09-28 NOTE — Telephone Encounter (Signed)
Competed.

## 2010-10-12 ENCOUNTER — Ambulatory Visit (INDEPENDENT_AMBULATORY_CARE_PROVIDER_SITE_OTHER): Payer: Medicare Other | Admitting: Family Medicine

## 2010-10-12 DIAGNOSIS — Z23 Encounter for immunization: Secondary | ICD-10-CM

## 2010-10-12 NOTE — Progress Notes (Signed)
  Subjective:    Patient ID: Derrick Cowan, male    DOB: 09-18-1940, 70 y.o.   MRN: [id number]  HPI  Here for flu shot.   Review of Systems     Objective:   Physical Exam        Assessment & Plan:

## 2010-11-19 ENCOUNTER — Other Ambulatory Visit: Payer: Self-pay | Admitting: Family Medicine

## 2010-12-13 ENCOUNTER — Other Ambulatory Visit: Payer: Self-pay | Admitting: Family Medicine

## 2011-02-15 ENCOUNTER — Other Ambulatory Visit: Payer: Self-pay | Admitting: *Deleted

## 2011-02-15 MED ORDER — SIMVASTATIN 20 MG PO TABS: 20.0000 mg | ORAL_TABLET | Freq: Every day | ORAL | Status: DC

## 2011-02-15 MED ORDER — LISINOPRIL 40 MG PO TABS: 40.0000 mg | ORAL_TABLET | Freq: Every day | ORAL | Status: DC

## 2011-02-21 ENCOUNTER — Encounter: Payer: Self-pay | Admitting: Family Medicine

## 2011-02-21 ENCOUNTER — Ambulatory Visit (INDEPENDENT_AMBULATORY_CARE_PROVIDER_SITE_OTHER): Payer: Medicare Other | Admitting: Family Medicine

## 2011-02-21 VITALS — BP 125/71 | HR 63 | Wt 181.0 lb

## 2011-02-21 DIAGNOSIS — I10 Essential (primary) hypertension: Secondary | ICD-10-CM

## 2011-02-21 DIAGNOSIS — E785 Hyperlipidemia, unspecified: Secondary | ICD-10-CM

## 2011-02-21 DIAGNOSIS — N400 Enlarged prostate without lower urinary tract symptoms: Secondary | ICD-10-CM

## 2011-02-21 DIAGNOSIS — Z0000 Encounter for general adult medical examination without abnormal findings: Secondary | ICD-10-CM

## 2011-02-21 DIAGNOSIS — Z125 Encounter for screening for malignant neoplasm of prostate: Secondary | ICD-10-CM

## 2011-02-21 MED ORDER — DILTIAZEM HCL ER COATED BEADS 120 MG PO CP24: 120.0000 mg | ORAL_CAPSULE | Freq: Every day | ORAL | Status: DC

## 2011-02-21 NOTE — Progress Notes (Signed)
Subjective:    Derrick Cowan is a 71 y.o. male who presents for Medicare Annual/Subsequent preventive examination.  Wife says the feels the amlodipine make shim irritable and cranky and would like it changed to something else.  Wife says he urinates every 1-2 hours.  Hard for them to go places bc has to urinate.   Preventive Screening-Counseling & Management  Tobacco History  Smoking status  . Never Smoker   Smokeless tobacco  . Not on file    Problems Prior to Visit Patient Active Problem List  Diagnoses Date Noted  . 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 ANKLE PAIN, LEFT 04/21/2009  . URINARY FREQUENCY 10/28/2007  . SLEEP APNEA, OBSTRUCTIVE 04/27/2007  . HYPERLIPIDEMIA 03/23/2007  . ESSENTIAL HYPERTENSION, BENIGN 03/23/2007  . CVA 03/23/2007     Current Problems (verified) Patient Active Problem List  Diagnoses  . HYPERLIPIDEMIA  . SLEEP APNEA, OBSTRUCTIVE  . ESSENTIAL HYPERTENSION, BENIGN  . CVA  . ANKLE PAIN, LEFT  . URINARY FREQUENCY  . Insulin resistance    Medications Prior to Visit Current Outpatient Prescriptions on File Prior to Visit  Medication Sig Dispense Refill  . amLODipine (NORVASC) 5 MG tablet TAKE 1 TABLET ONCE A DAY  90 tablet  0  . aspirin 81 MG tablet Take 81 mg by mouth daily.        . bimatoprost (LUMIGAN) 0.03 % ophthalmic drops 1 drop daily.        . brimonidine (ALPHAGAN P) 0.1 % SOLN        . hydrochlorothiazide 25 MG tablet TAKE 1 TABLET DAILY  90 tablet  2  . lisinopril (PRINIVIL,ZESTRIL) 40 MG tablet Take 1 tablet (40 mg total) by mouth daily.  90 tablet  1  . simvastatin (ZOCOR) 20 MG tablet Take 1 tablet (20 mg total) by mouth at bedtime.  90 tablet  1    Current Medications (verified) Current Outpatient Prescriptions  Medication Sig Dispense Refill  . amLODipine (NORVASC) 5 MG tablet TAKE 1 TABLET ONCE A DAY  90 tablet  0  . aspirin 81 MG tablet Take 81 mg by mouth daily.        . bimatoprost (LUMIGAN) 0.03 % ophthalmic drops 1  drop daily.        . brimonidine (ALPHAGAN P) 0.1 % SOLN        . hydrochlorothiazide 25 MG tablet TAKE 1 TABLET DAILY  90 tablet  2  . lisinopril (PRINIVIL,ZESTRIL) 40 MG tablet Take 1 tablet (40 mg total) by mouth daily.  90 tablet  1  . simvastatin (ZOCOR) 20 MG tablet Take 1 tablet (20 mg total) by mouth at bedtime.  90 tablet  1     Allergies (verified) Review of patient's allergies indicates no known allergies.   PAST HISTORY  Family History Family History  Problem Relation Age of Onset  . Heart disease Other     family hx of    Social History History  Substance Use Topics  . Smoking status: Never Smoker   . Smokeless tobacco: Not on file  . Alcohol Use: No    Are there smokers in your home (other than you)?  No  Risk Factors Current exercise habits: Gym/ health club routine includes low impact aerobics and walking on track .  Dietary issues discussed: healthy diet    Cardiac risk factors: advanced age (older than 6 for men, 68 for women), dyslipidemia, hypertension and male gender.  Depression Screen (Note: if answer to either of  the following is "Yes", a more complete depression screening is indicated)   Q1: Over the past two weeks, have you felt down, depressed or hopeless? No  Q2: Over the past two weeks, have you felt little interest or pleasure in doing things? No  Have you lost interest or pleasure in daily life? No  Do you often feel hopeless? No  Do you cry easily over simple problems? No  Activities of Daily Living In your present state of health, do you have any difficulty performing the following activities?:  Driving? Yes Managing money?  Yes Feeding yourself? No  Getting from bed to chair? No Climbing a flight of stairs? No Preparing food and eating?: No Bathing or showering? No Getting dressed: No Getting to the toilet? No Using the toilet:No Moving around from place to place: No In the past year have you fallen or had a near  fall?:No   Are you sexually active?  No  Do you have more than one partner?  No  Hearing Difficulties: No Do you often ask people to speak up or repeat themselves? No Do you experience ringing or noises in your ears? No Do you have difficulty understanding soft or whispered voices? No   Do you feel that you have a problem with memory? Yes  Do you often misplace items? Yes  Do you feel safe at home?  Yes  Cognitive Testing  Alert? Yes  Normal Appearance?Yes  Oriented to person? Yes  Place? Yes   Time? Yes  Recall of three objects?  Yes  Can perform simple calculations? Yes  Displays appropriate judgment?Yes  Can read the correct time from a watch face?Yes   Advanced Directives have been discussed with the patient? Yes   List the Names of Other Physician/Practitioners you currently use: 1.    Indicate any recent Medical Services you may have received from other than [facility] providers in the past year (date may be approximate).  Immunization History  Administered Date(s) Administered  . Influenza Split 10/12/2010  . Influenza Whole 11/25/2008, 01/01/2010  . Pneumococcal Polysaccharide 04/12/2008  . Td 03/23/2007    Screening Tests Health Maintenance  Topic Date Due  . Influenza Vaccine  10/15/2011  . Colonoscopy  01/14/2014  . Tetanus/tdap  03/22/2017  . Pneumococcal Polysaccharide Vaccine Age 39 And Over  Completed  . Zostavax  Addressed    All answers were reviewed with the patient and necessary referrals were made:  METHENEY,CATHERINE, MD   02/21/2011   History reviewed: allergies, current medications, past family history, past medical history, past social history, past surgical history and problem list  Review of Systems A comprehensive review of systems was negative.    Objective:     Vision by Snellen chart: right eye:20/20, left eye:20/30 Blood pressure 125/71, pulse 63, weight 181 lb (82.101 kg). There is no height on file to calculate BMI.  BP 125/71   Pulse 63  Wt 181 lb (82.101 kg)  General Appearance:    Alert, cooperative, no distress, appears stated age  Head:    Normocephalic, without obvious abnormality, atraumatic  Eyes:    PERRL, conjunctiva/corneas clear, EOM's intact, both eyes  Ears:    Normal TM's and external ear canals, both ears  Nose:   Nares normal, septum midline, mucosa normal, no drainage    or sinus tenderness  Throat:   Lips, mucosa, and tongue normal; teeth and gums normal  Neck:   Supple, symmetrical, trachea midline, no adenopathy;  thyroid:  No enlargement/tenderness/nodules; no carotid   bruit or JVD  Back:     Symmetric, no curvature, ROM normal, no CVA tenderness  Lungs:     Clear to auscultation bilaterally, respirations unlabored  Chest wall:    No tenderness or deformity  Heart:    Regular rate and rhythm, S1 and S2 normal, no murmur, rub   or gallop  Abdomen:     Soft, non-tender, bowel sounds active all four quadrants,    no masses, no organomegaly  Genitalia:    Not performed.   Rectal:    Normal tone, Enlarged prostate, no masses or tenderness; it is asymmetric.   guaiac negative stool  Extremities:   Extremities normal, atraumatic, no cyanosis or edema  Pulses:   2+ and symmetric all extremities  Skin:   Skin color, texture, turgor normal, no rashes or lesions  Lymph nodes:   Cervical, supraclavicular, and axillary nodes normal  Neurologic:   CNII-XII intact. Normal strength, sensation and reflexes      throughout       Assessment:     Annual Wellness exam.      Plan:     During the course of the visit the patient was educated and counseled about appropriate screening and preventive services including:    Screening electrocardiogram  Prostate cancer screening  EKG shows rate of 57 bpm, no acute changes.  Prlonged PR interval consistant with 1 st degree block.    BPH-enlarged on exam today. Will check PSA. Consider dong AUA score and trying medication if PSA is normal.    Diet review for nutrition referral? Yes ____  Not Indicated _x___   Patient Instructions (the written plan) was given to the patient.  Medicare Attestation I have personally reviewed: The patient's medical and social history Their use of alcohol, tobacco or illicit drugs Their current medications and supplements The patient's functional ability including ADLs,fall risks, home safety risks, cognitive, and hearing and visual impairment Diet and physical activities Evidence for depression or mood disorders  The patient's weight, height, BMI, and visual acuity have been recorded in the chart.  I have made referrals, counseling, and provided education to the patient based on review of the above and I have provided the patient with a written personalized care plan for preventive services.     METHENEY,CATHERINE, MD   02/21/2011

## 2011-02-21 NOTE — Patient Instructions (Addendum)
Start a regular exercise program and make sure you are eating a healthy diet Try to eat 4 servings of dairy a day or take a calcium supplement (500mg  twice a day). Your vaccines are up to date.  You are due for cholesterol check in April. We will stop you amlodipine and start cardizem in its place. Continue all other blood pressure and cholesterol medications.

## 2011-02-26 LAB — COMPLETE METABOLIC PANEL WITH GFR
ALT: 14 U/L (ref 0–53)
Albumin: 4.1 g/dL (ref 3.5–5.2)
CO2: 26 mEq/L (ref 19–32)
Creat: 1.08 mg/dL (ref 0.50–1.35)
GFR, Est Non African American: 69 mL/min
Potassium: 4.7 mEq/L (ref 3.5–5.3)
Total Bilirubin: 0.5 mg/dL (ref 0.3–1.2)

## 2011-02-26 LAB — PSA: PSA: 4.51 ng/mL — ABNORMAL HIGH (ref <=4.00)

## 2011-03-01 ENCOUNTER — Ambulatory Visit: Payer: Medicare Other | Admitting: Family Medicine

## 2011-03-01 ENCOUNTER — Encounter: Payer: Self-pay | Admitting: *Deleted

## 2011-03-05 ENCOUNTER — Ambulatory Visit (INDEPENDENT_AMBULATORY_CARE_PROVIDER_SITE_OTHER): Payer: Medicare Other | Admitting: Family Medicine

## 2011-03-05 DIAGNOSIS — R7309 Other abnormal glucose: Secondary | ICD-10-CM

## 2011-03-05 LAB — POCT GLYCOSYLATED HEMOGLOBIN (HGB A1C): Hemoglobin A1C: 5.6

## 2011-03-05 NOTE — Progress Notes (Signed)
  Subjective:    Patient ID: Derrick Cowan, male    DOB: 08/03/1940, 71 y.o.   MRN: [id number] A1C check HPI    Review of Systems     Objective:   Physical Exam        Assessment & Plan:  Abnormal glucose-A1c is in the normal range. It is borderline but typically he is normal. No diabetes./c. Linford Arnold, MD.

## 2011-03-25 ENCOUNTER — Other Ambulatory Visit: Payer: Self-pay | Admitting: *Deleted

## 2011-03-25 MED ORDER — DILTIAZEM HCL ER COATED BEADS 120 MG PO CP24: 120.0000 mg | ORAL_CAPSULE | Freq: Every day | ORAL | Status: DC

## 2011-03-27 ENCOUNTER — Telehealth: Payer: Self-pay | Admitting: Family Medicine

## 2011-03-27 MED ORDER — PRAVASTATIN SODIUM 40 MG PO TABS: 40.0000 mg | ORAL_TABLET | Freq: Every day | ORAL | Status: DC

## 2011-03-27 NOTE — Telephone Encounter (Signed)
Because he is on diltiazem and would be safer for him to switch to pravastatin instead of simvastatin which he is currently taking for his cholesterol. I will send her prescription for pravastatin 40 mg which is fairly equivalent to simvastatin 20 which he already takes.

## 2011-03-27 NOTE — Telephone Encounter (Signed)
Pt.notified

## 2011-04-05 IMAGING — CR DG ANKLE COMPLETE 3+V*L*
3 series · 3 of 3 positions shown · non-contrast
Comparison: None

CLINICAL DATA: Left ankle pain.

LEFT ANKLE COMPLETE - 3+ VIEW

[view not recorded (1 of 3)]
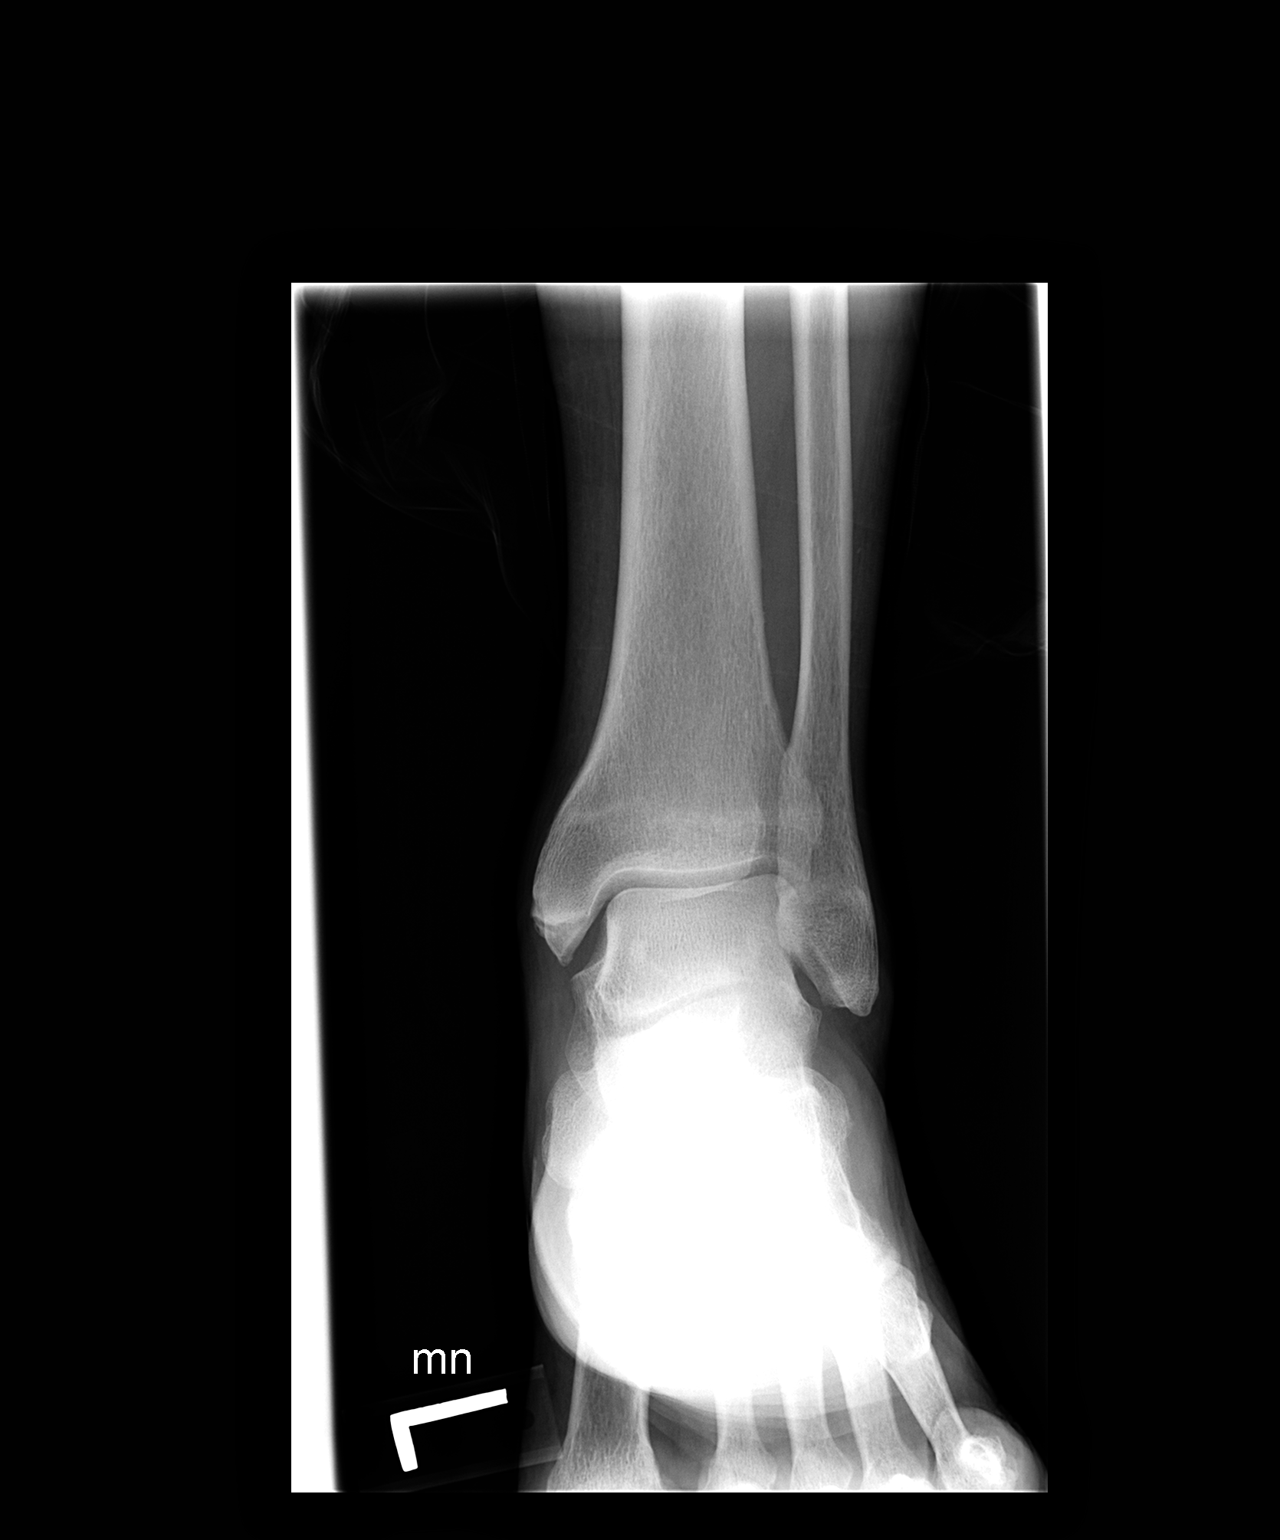

[view not recorded (2 of 3)]
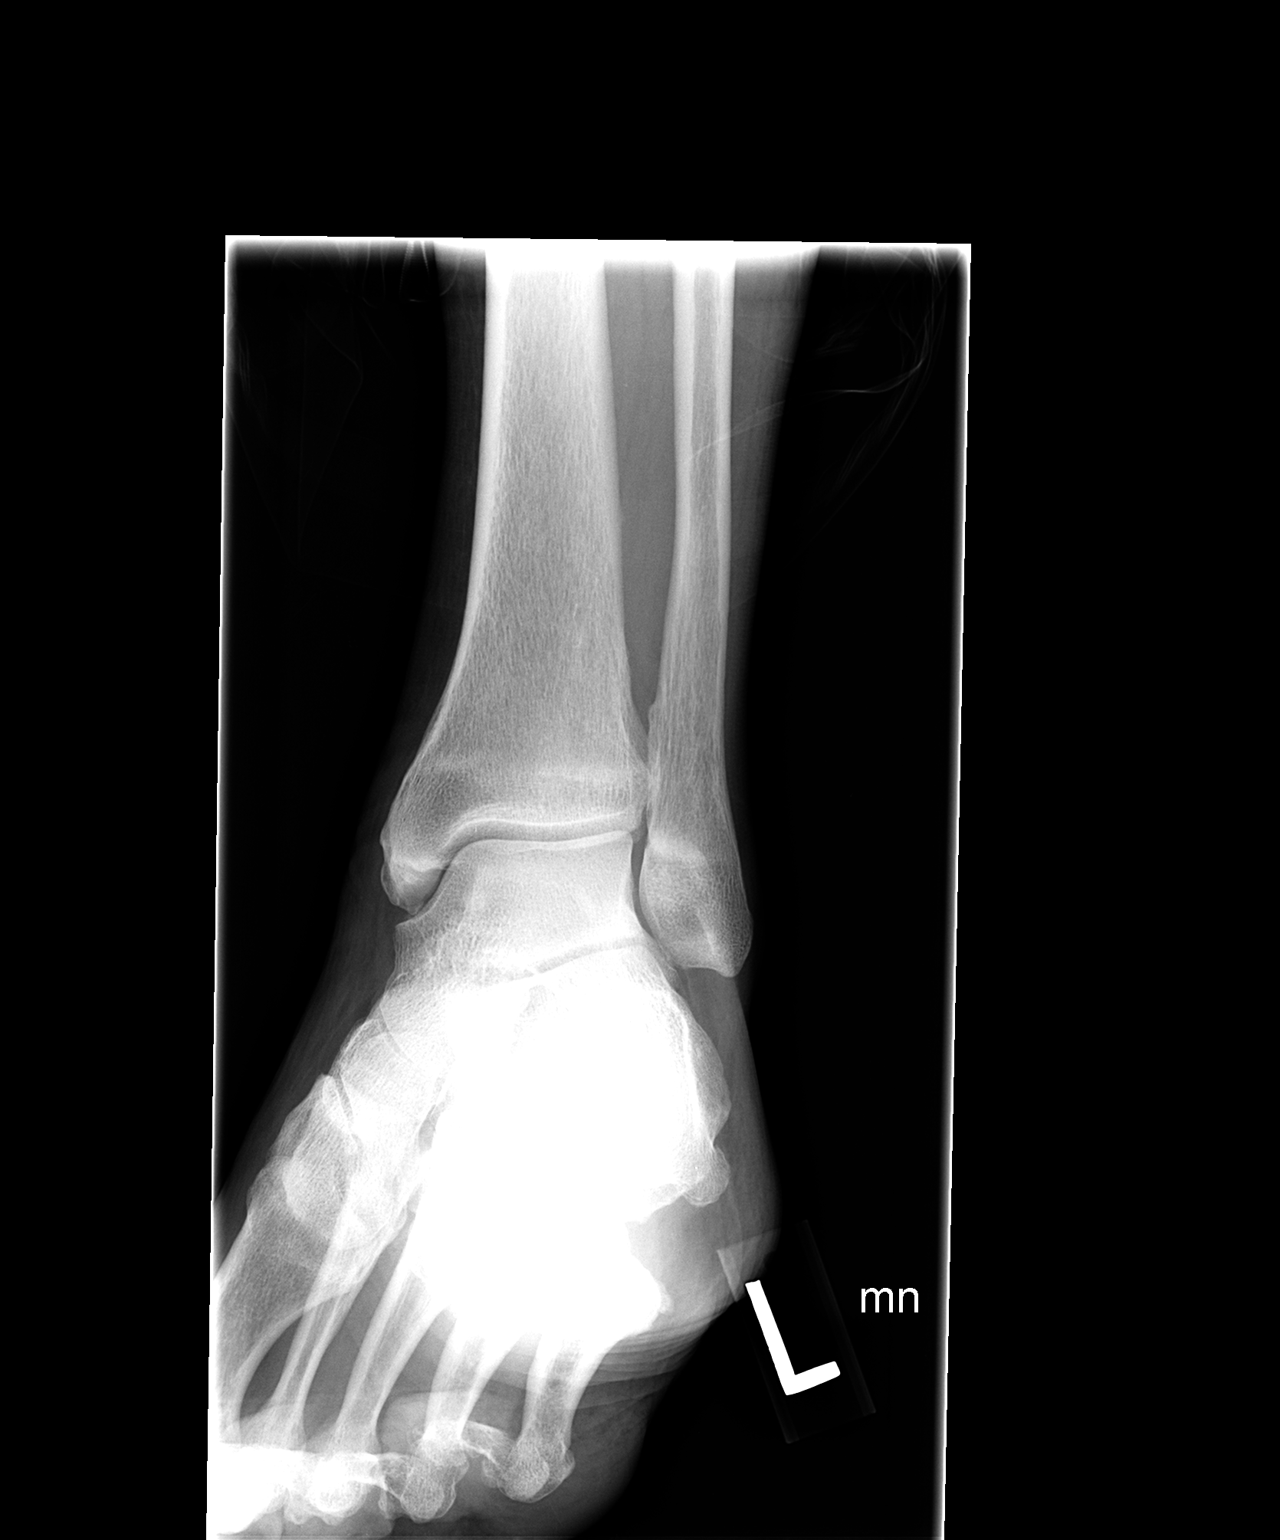

[view not recorded (3 of 3)]
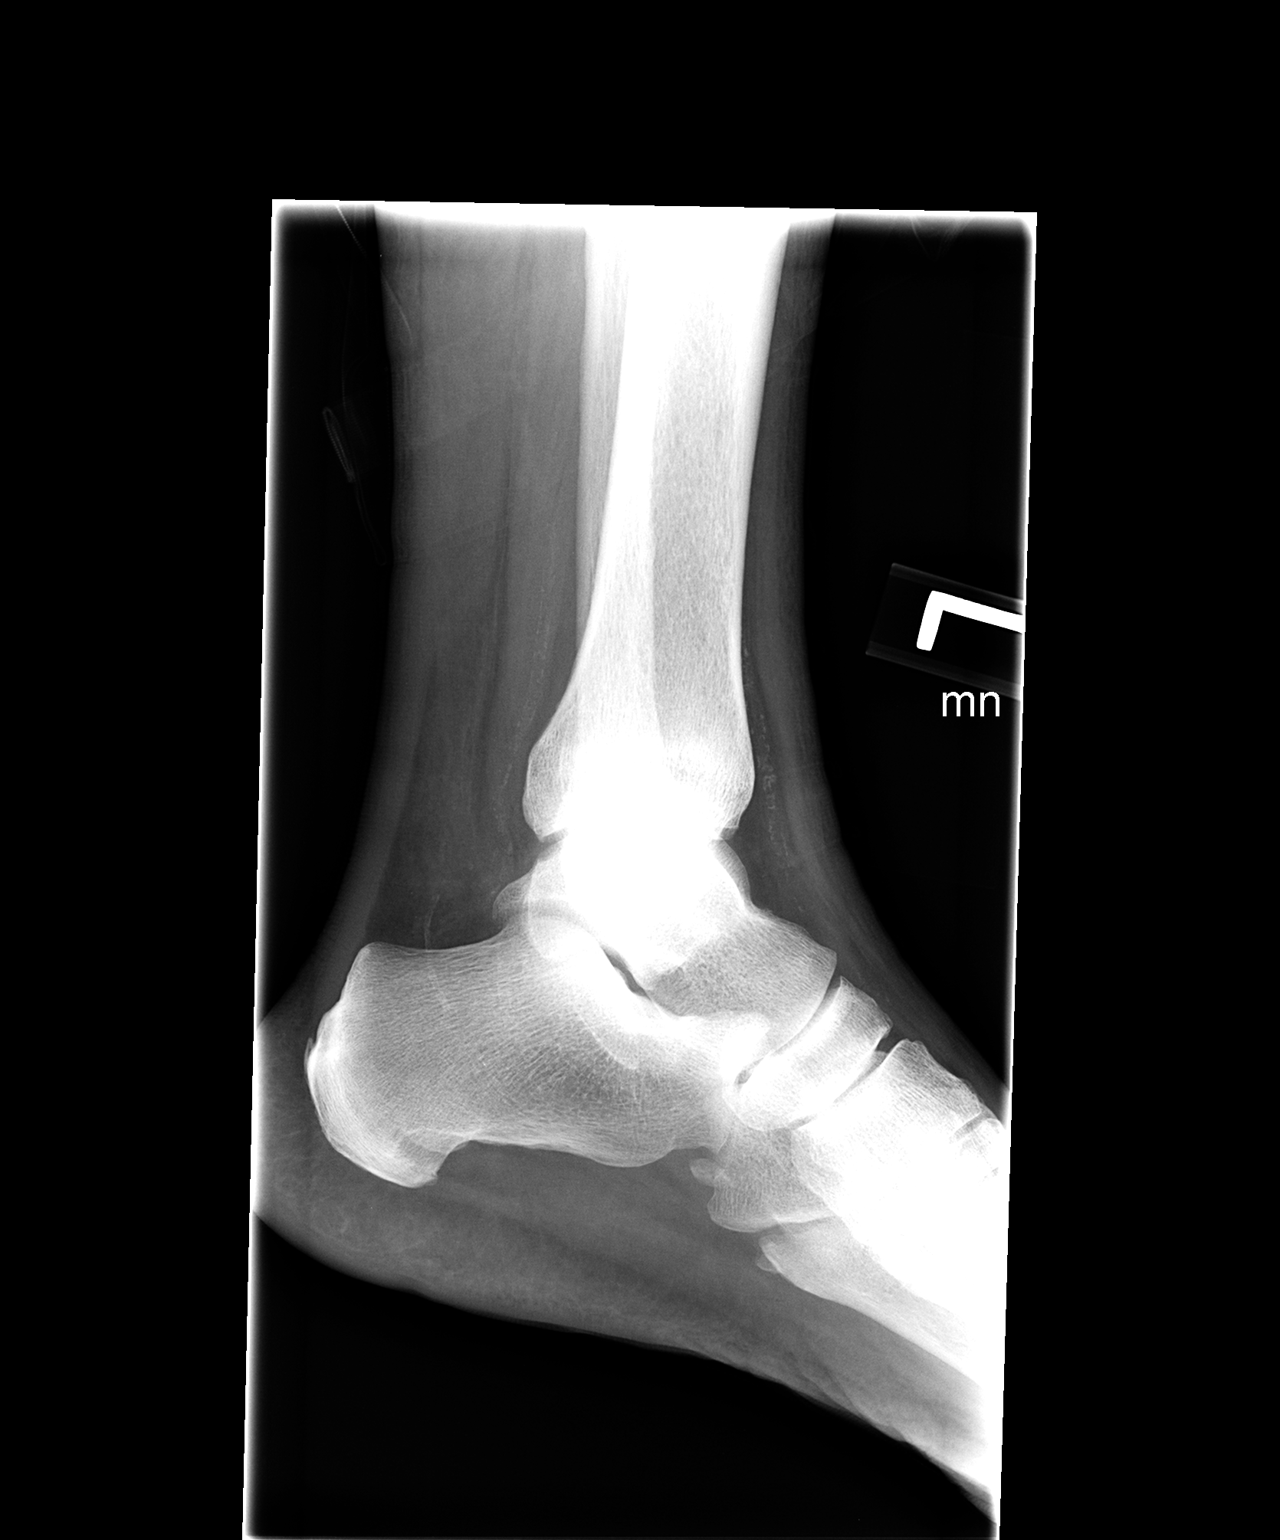

[3 of 3 positions shown; findings below may reference images not displayed]

FINDINGS: The ankle mortise is maintained.  No acute fractures are
seen.  No osteochondral lesions or significant degenerative
changes.  Small vessel calcifications are noted.
IMPRESSION: No acute bony findings or significant degenerative changes.

## 2011-05-21 ENCOUNTER — Ambulatory Visit (INDEPENDENT_AMBULATORY_CARE_PROVIDER_SITE_OTHER): Payer: Medicare Other | Admitting: Family Medicine

## 2011-05-21 ENCOUNTER — Encounter: Payer: Self-pay | Admitting: Family Medicine

## 2011-05-21 VITALS — BP 162/78 | HR 61 | Ht 67.0 in | Wt 179.0 lb

## 2011-05-21 DIAGNOSIS — I10 Essential (primary) hypertension: Secondary | ICD-10-CM

## 2011-05-21 LAB — COMPLETE METABOLIC PANEL WITH GFR
ALT: 12 U/L (ref 0–53)
Albumin: 4.3 g/dL (ref 3.5–5.2)
Alkaline Phosphatase: 48 U/L (ref 39–117)
BUN: 18 mg/dL (ref 6–23)
CO2: 26 mEq/L (ref 19–32)
Chloride: 104 mEq/L (ref 96–112)
Glucose, Bld: 103 mg/dL — ABNORMAL HIGH (ref 70–99)
Potassium: 4.7 mEq/L (ref 3.5–5.3)

## 2011-05-21 LAB — LIPID PANEL
LDL Cholesterol: 86 mg/dL (ref 0–99)
Total CHOL/HDL Ratio: 3.9 Ratio

## 2011-05-21 MED ORDER — DILTIAZEM HCL ER COATED BEADS 240 MG PO CP24: 240.0000 mg | ORAL_CAPSULE | Freq: Every day | ORAL | Status: DC

## 2011-05-21 MED ORDER — DILTIAZEM HCL ER COATED BEADS 120 MG PO CP24: 120.0000 mg | ORAL_CAPSULE | Freq: Every day | ORAL | Status: DC

## 2011-05-21 NOTE — Patient Instructions (Addendum)
Make sure taking your cardizem every day Make sure taking whole tab on your lisinopril.

## 2011-05-21 NOTE — Progress Notes (Signed)
  Subjective:    Patient ID: Derrick Cowan, male    DOB: Mar 13, 1940, 71 y.o.   MRN: [id number]  HPI HTN- taking meds reguarly. No home BP check.  No CP ro SOB.  Does walk and goes to [facility]. Rarely eats out mostly east low salt diet. After further questioning patient admits that Derrick Cowan only takes half a tab of all of his medications. The Cardizem capsule Derrick Cowan is actually been taking every other day. Otherwise, Derrick Cowan's been tolerating them well. No edema or swelling.    Review of Systems     Objective:   Physical Exam  Constitutional: Derrick Cowan is oriented to person, place, and time. Derrick Cowan appears well-developed and well-nourished.  HENT:  Head: Normocephalic and atraumatic.  Neck: Neck supple. No thyromegaly present.  Cardiovascular: Normal rate, regular rhythm and normal heart sounds.        No carotid bruits.    Pulmonary/Chest: Effort normal and breath sounds normal.  Lymphadenopathy:    Derrick Cowan has no cervical adenopathy.  Neurological: Derrick Cowan is alert and oriented to person, place, and time.  Skin: Skin is warm and dry.  Psychiatric: Derrick Cowan has a normal mood and affect. His behavior is normal.          Assessment & Plan:  HTN - am not clear on why Derrick Cowan was only taking half a tab of all his medications. Asked him to please make sure Derrick Cowan is taking hold on the lisinopril. If Derrick Cowan is concerned about going to the bathroom a lot and Derrick Cowan can continue to have a tab of HCTZ. I also encouraged him to take his Cardizem every day instead of every other day. Explained to him that it really only last for 24 hours and can cause major swings in his blood pressure if Derrick Cowan is not taking it daily. I will like to see him back in one month to make sure his blood pressures getting back under good control and to make sure Derrick Cowan is tolerating the increased dose as well.

## 2011-05-22 ENCOUNTER — Encounter: Payer: Self-pay | Admitting: *Deleted

## 2011-07-02 ENCOUNTER — Ambulatory Visit (INDEPENDENT_AMBULATORY_CARE_PROVIDER_SITE_OTHER): Payer: Medicare Other | Admitting: Family Medicine

## 2011-07-02 ENCOUNTER — Encounter: Payer: Self-pay | Admitting: Family Medicine

## 2011-07-02 VITALS — BP 144/73 | HR 62 | Ht 67.0 in | Wt 179.0 lb

## 2011-07-02 DIAGNOSIS — Z9181 History of falling: Secondary | ICD-10-CM

## 2011-07-02 DIAGNOSIS — I10 Essential (primary) hypertension: Secondary | ICD-10-CM

## 2011-07-02 DIAGNOSIS — R413 Other amnesia: Secondary | ICD-10-CM

## 2011-07-02 DIAGNOSIS — Z8673 Personal history of transient ischemic attack (TIA), and cerebral infarction without residual deficits: Secondary | ICD-10-CM

## 2011-07-02 DIAGNOSIS — Z1331 Encounter for screening for depression: Secondary | ICD-10-CM

## 2011-07-02 DIAGNOSIS — H6120 Impacted cerumen, unspecified ear: Secondary | ICD-10-CM

## 2011-07-02 NOTE — Patient Instructions (Signed)
Home Safety and Preventing Falls Falls are a leading cause of injury and while they affect all age groups, falls have greater short-term and long-term impact on older age groups. However, falls should not be a part of life or aging. It is possible for individuals and their families to use preventive measures to significantly decrease the likelihood that anyone, especially an older adult, will fall. There are many simple measures which can make your home safer with respect to preventing falls. The following actions can help reduce falls among all members of your family and are especially important as you age, when your balance, lower limb strength, coordination, and eyesight may be declining. The use of preventive measures will help to reduce you and your family's risk of falls and serious medical consequences. OUTDOORS  Repair cracks and edges of walkways and driveways.   Remove high doorway thresholds and trim shrubbery on the main path into your home.   Ensure there is good outside lighting at main entrances and along main walkways.   Clear walkways of tools, rocks, debris, and clutter.   Check that handrails are not broken and are securely fastened. Both sides of steps should have handrails.   In the garage, be attentive to and clean up grease or oil spills on the cement. This can make the surface extremely slippery.   In winter, have leaves, snow, and ice cleared regularly.   Use sand or salt on walkways during winter months.  BATHROOM  Install grab bars by the toilet and in the tub and shower.   Use non-skid mats or decals in the tub or shower.   If unable to easily stand unsupported while showering, place a plastic non slip stool in the shower to sit on when needed.   Install night lights.   Keep floors dry and clean up all water on the floor immediately.   Remove soap buildup in tub or shower on a regular basis.   Secure bath mats with non-slip, double-sided rug tape.    Remove tripping hazards from the floors.  BEDROOMS  Install night lights.   Do not use oversized bedding.   Make sure a bedside light is easy to reach.   Keep a telephone by your bedside.   Make sure that you can get in and out of your bed easily.   Have a firm chair, with side arms, to use for getting dressed.   Remove clutter from around closets.   Store clothing, bed coverings, and other household items where you can reach them comfortably.   Remove tripping hazards from the floor.  LIVING AREAS AND STAIRWAYS  Turn on lights to avoid having to walk through dark areas.   Keep lighting uniform in each room. Place brighter lightbulbs in darker areas, including stairways.   Replace lightbulbs that burn out in stairways immediately.   Arrange furniture to provide for clear pathways.   Keep furniture in the same place.   Eliminate or tape down electrical cables in high traffic areas.   Place handrails on both sides of stairways. Use handrails when going up or down stairs.   Most falls occur on the top or bottom 3 steps.   Fix any loose handrails. Make sure handrails on both sides of the stairways are as long as the stairs.   Remove all walkway obstacles.   Coil or tape electrical cords off to the side of walking areas and out of the way. If using many extension cords, have an electrician   put in a new wall outlet to reduce or eliminate them.   Make sure spills are cleaned up quickly and allow time for drying before walking on freshly cleaned floors.   Firmly attach carpet with non-skid or two-sided tape.   Keep frequently used items within easy reach.   Remove tripping hazards such as throw rugs and clutter in walkways. Never leave objects on stairs.   Get rid of throw rugs elsewhere if possible.   Eliminate uneven floor surfaces.   Make sure couches and chairs are easy to get into and out of.   Check carpeting to make sure it is firmly attached along stairs.    Make repairs to worn or loose carpet promptly.   Select a carpet pattern that does not visually hide the edge of steps.   Avoid placing throw rugs or scatter rugs at the top or bottom of stairways, or properly secure with carpet tape to prevent slippage.   Have an electrician put in a light switch at the top and bottom of the stairs.   Get light switches that glow.   Avoid the following practices: hurrying, inattention, obscured vision, carrying large loads, and wearing slip-on shoes.   Be aware of all pets.  KITCHEN  Place items that are used frequently, such as dishes and food, within easy reach.   Keep handles on pots and pans toward the center of the stove. Use back burners when possible.   Make sure spills are cleaned up quickly and allow time for drying.   Avoid walking on wet floors.   Avoid hot utensils and knives.   Position shelves so they are not too high or low.   Place commonly used objects within easy reach.   If necessary, use a sturdy step stool with a grab bar when reaching.   Make sure electrical cables are out of the way.   Do not use floor polish or wax that makes floors slippery.  OTHER HOME FALL PREVENTION STRATEGIES  Wear low heel or rubber sole shoes that are supportive and fit well.   Wear closed toe shoes.   Know and watch for side effects of medications. Have your caregiver or pharmacist look at all your medicines, even over-the-counter medicines. Some medicines can make you sleepy or dizzy.   Exercise regularly. Exercise makes you stronger and improves your balance and coordination.   Limit use of alcohol.   Use eyeglasses if necessary and keep them clean. Have your vision checked every year.   Organize your household in a manner that minimizes the need to walk distances when hurried, or go up and down stairs unnecessarily. For example, have a phone placed on at least each floor of your home. If possible, have a phone beside each sitting  or lying area where you spend the most time at home. Keep emergency numbers posted at all phones.   Use non-skid floor wax.   When using a ladder, make sure:   The base is firm.   All ladder feet are on level ground.   The ladder is angled against the wall properly.   When climbing a ladder, face the ladder and hold the ladder rungs firmly.   If reaching, always keep your hips and body weight centered between the rails.   When using a stepladder, make sure it is fully opened and both spreaders are firmly locked.   Do not climb a closed stepladder.   Avoid climbing beyond the second step from the top   of a stepladder and the 4th rung from the top of an extension ladder.   Learn and use mobility aids as needed.   Change positions slowly. Arise slowly from sitting and lying positions. Sit on the edge of your bed before getting to your feet.   If you have a history of falls, ask someone to add color or contrast paint or tape to grab bars and handrails in your home.   If you have a history of falls, ask someone to place contrasting color strips on first and last steps.   Install an electrical emergency response system if you need one, and know how to use it.   If you have a medical or other condition that causes you to have limited physical strength, it is important that you reach out to family and friends for occasional help.  FOR CHILDREN:  If young children are in the home, use safety gates. At the top of stairs use screw-mounted gates; use pressure-mounted gates for the bottom of the stairs and doorways between rooms.   Young children should be taught to descend stairs on their stomachs, feet first, and later using the handrail.   Keep drawers fully closed to prevent them from being climbed on or pulled out entirely.   Move chairs, cribs, beds and other furniture away from windows.   Consider installing window guards on windows ground floor and up, unless they are emergency  fire exits. Make sure they have easy release mechanisms.   Consider installing special locks that only allow the window to be opened to a certain height.   Never rely on window screens to prevent falls.   Never leave babies alone on changing tables, beds or sofas. Use a changing table that has a restraining strap.   When a child can pull to a standing position, the crib mattress should be adjusted to its lowest position. There should be at least 26 inches between the top rails of the crib drop side and the mattress. Toys, bumper pads, and other objects that can be used as steps to climb out should be removed from the crib.   On bunk beds never allow a child under age 6 to sleep on the top bunk. For older children, if the upper bunk is not against a wall, use guard rails on both sides. No matter how old a child is, keep the guard rails in place on the top bunk since children roll during sleep. Do not permit horseplay on bunks.   Grass and soil surfaces beneath backyard playground equipment should be replaced with hardwood chips, shredded wood mulch, sand, pea gravel, rubber, crushed stone, or another safer material at depths of at least 9 to 12 inches.   When riding bikes or using skates, skateboards, skis, or snowboards, require children to wear helmets. Look for those that have stickers stating that they meet or exceed safety standards.   Vertical posts or pickets in deck, balcony, and stairway railings should be no more than 3 1/2 inches apart if a young baby will have access to the area. The space between horizontal rails or bars, and between the floor and the first horizontal rail or bar, should be no more than 3 1/2 inches.  Document Released: 12/21/2001 Document Revised: 12/20/2010 Document Reviewed: 10/20/2008 [employer] Patient Information 2012 [employer]. 

## 2011-07-02 NOTE — Progress Notes (Signed)
  Subjective:    Patient ID: Derrick Cowan, male    DOB: November 09, 1940, 71 y.o.   MRN: [id number]  HPI  HTN- Only taking half a tab of the cardizem. No CP or SOB.  Taking other meds.   Memory Loss - Wife feels like he is having memory. Sometimes forgets phone numbness.  Forgets conversations at times.  Wife says has been very irritable.   His wife says he is not hearing as well and wonders if he could have wax buildup.  Review of Systems     Objective:   Physical Exam  Constitutional: He is oriented to person, place, and time. He appears well-developed and well-nourished.  HENT:  Head: Normocephalic and atraumatic.       Cerumen impaction on the right. Left TM and canal are clear. Tm is thick and white on the left.   Cardiovascular: Normal rate, regular rhythm and normal heart sounds.   Pulmonary/Chest: Effort normal and breath sounds normal.  Neurological: He is alert and oriented to person, place, and time.  Skin: Skin is warm and dry.  Psychiatric: He has a normal mood and affect. His behavior is normal.          Assessment & Plan:  HTN - Work on increasing the cardizem to whole tab. F/U in 6 weeks to repeat BP.   Memory Loss - MMSE administered,  passed.  Score of 28/30. Normal based on age. Hx of CVA. We discussed that we'll repeat the Mini-Mental status exam in 6 months to see if there is any change or progression.  Cerumen impaction-canal irrigated. I reexamined the TM and canal it looks great.  Fall assesssment - Score of 13 (high risk). Given handout for prevention of falls in the home. Reviewed some things that they can do at home to reduce falls such as removing loose were, exposed cords et Karie Soda.  Depression Screen - PHQ-9 score of 1. Negative for depression.       \

## 2011-08-19 ENCOUNTER — Encounter: Payer: Self-pay | Admitting: Family Medicine

## 2011-08-19 ENCOUNTER — Ambulatory Visit (INDEPENDENT_AMBULATORY_CARE_PROVIDER_SITE_OTHER): Payer: Medicare Other | Admitting: Family Medicine

## 2011-08-19 VITALS — BP 142/79 | HR 87 | Ht 67.0 in | Wt 177.0 lb

## 2011-08-19 DIAGNOSIS — R82998 Other abnormal findings in urine: Secondary | ICD-10-CM

## 2011-08-19 DIAGNOSIS — I10 Essential (primary) hypertension: Secondary | ICD-10-CM

## 2011-08-19 LAB — POCT URINALYSIS DIPSTICK
Nitrite, UA: NEGATIVE
Protein, UA: NEGATIVE
Spec Grav, UA: 1.01
Urobilinogen, UA: 0.2

## 2011-08-19 NOTE — Patient Instructions (Signed)
Keep HCTZ at half a tab. But increase the lisinopril to whole tab (40mg ).

## 2011-08-19 NOTE — Progress Notes (Signed)
  Subjective:    Patient ID: Derrick Cowan, male    DOB: 10-14-1940, 71 y.o.   MRN: [id number]  HPI HTN- NO CP or SOB. Taking half a tab of his hctz and lisinopril.  Taking whole capsule of dilitazem   Has had dark urine for months.  No dysuria.  No fever. No history of proteinuria or hematuria.  Review of Systems     Objective:   Physical Exam  Constitutional: He is oriented to person, place, and time. He appears well-developed and well-nourished.  HENT:  Head: Normocephalic and atraumatic.  Cardiovascular: Normal rate, regular rhythm and normal heart sounds.   Pulmonary/Chest: Effort normal and breath sounds normal.  Neurological: He is alert and oriented to person, place, and time.  Skin: Skin is warm and dry.  Psychiatric: He has a normal mood and affect. His behavior is normal.          Assessment & Plan:  HTN- Keep HCTZ at half a tab. But increase the lisinopril to whole tab (40mg ). Doing well on dose.  No SE.  F/U in 6 weeks to recheck BP.  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 Continue to work on low-salt diet and regular exercise activity.  Dark Urine - negative for blood and protein. He needs to work on increasing his fluids. 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

## 2011-08-30 ENCOUNTER — Other Ambulatory Visit: Payer: Self-pay

## 2011-08-30 MED ORDER — DILTIAZEM HCL ER COATED BEADS 120 MG PO CP24: 120.0000 mg | ORAL_CAPSULE | Freq: Every day | ORAL | Status: DC

## 2011-08-30 MED ORDER — PRAVASTATIN SODIUM 40 MG PO TABS: 40.0000 mg | ORAL_TABLET | Freq: Every day | ORAL | Status: DC

## 2011-08-30 MED ORDER — HYDROCHLOROTHIAZIDE 25 MG PO TABS: 12.5000 mg | ORAL_TABLET | Freq: Every day | ORAL | Status: DC

## 2011-08-30 NOTE — Telephone Encounter (Signed)
No mention of the Diltiazem on last note. If continued use of the Diltiazem is needed please sign prescription. Thank you

## 2011-08-30 NOTE — Telephone Encounter (Signed)
Patient was seen in August for his blood pressure and advised to continue with the half of tablet of HCTZ. He should have a 6 week follow up in September.

## 2011-09-30 ENCOUNTER — Encounter: Payer: Self-pay | Admitting: Family Medicine

## 2011-09-30 ENCOUNTER — Ambulatory Visit (INDEPENDENT_AMBULATORY_CARE_PROVIDER_SITE_OTHER): Payer: Medicare Other | Admitting: Family Medicine

## 2011-09-30 VITALS — BP 137/76 | HR 58 | Wt 177.0 lb

## 2011-09-30 DIAGNOSIS — R350 Frequency of micturition: Secondary | ICD-10-CM

## 2011-09-30 DIAGNOSIS — Z23 Encounter for immunization: Secondary | ICD-10-CM

## 2011-09-30 DIAGNOSIS — I10 Essential (primary) hypertension: Secondary | ICD-10-CM

## 2011-09-30 DIAGNOSIS — R972 Elevated prostate specific antigen [PSA]: Secondary | ICD-10-CM

## 2011-09-30 LAB — BASIC METABOLIC PANEL WITH GFR
Calcium: 9.6 mg/dL (ref 8.4–10.5)
GFR, Est African American: 74 mL/min
Glucose, Bld: 110 mg/dL — ABNORMAL HIGH (ref 70–99)
Sodium: 140 mEq/L (ref 135–145)

## 2011-09-30 NOTE — Patient Instructions (Addendum)
Flu vaccine given today. 

## 2011-09-30 NOTE — Progress Notes (Signed)
  Subjective:    Patient ID: Derrick Cowan, male    DOB: 11-13-1940, 71 y.o.   MRN: [id number]  HPI HTN- here to followup for blood pressure. We increased his lisinopril at the last office visit. It is tolerating it well without any side effects or difficulty. His wife is with him today reports that he still has a lot of urinary frequency. She says he typically goes 8-10 times a day. He says that it's not bothersome and he feels he only goes every 3 hours. We also recently cut his HCTZ in half.   Review of Systems     Objective:   Physical Exam  Constitutional: He is oriented to person, place, and time. He appears well-developed and well-nourished.  HENT:  Head: Normocephalic and atraumatic.  Cardiovascular: Normal rate, regular rhythm and normal heart sounds.   Pulmonary/Chest: Effort normal and breath sounds normal.  Neurological: He is alert and oriented to person, place, and time.  Skin: Skin is warm and dry.  Psychiatric: He has a normal mood and affect. His behavior is normal.          Assessment & Plan:  HTN- well controlled today. Continue current regimen. Followup in 4-6 months.  Urinary frequency-may be because of the HCTZ that we did her his dose. Also consider benign prostatic hypertrophy. Did not go into this further because patient seemed to disagree with his wife about the urinary frequency itself.  Elevated PSA-it was elevated back in February. We will just recheck in March. We will check again today.  Flu vaccine given today.

## 2011-10-09 ENCOUNTER — Encounter: Payer: Self-pay | Admitting: *Deleted

## 2012-02-03 ENCOUNTER — Ambulatory Visit (INDEPENDENT_AMBULATORY_CARE_PROVIDER_SITE_OTHER): Payer: Medicare Other | Admitting: Family Medicine

## 2012-02-03 ENCOUNTER — Encounter: Payer: Self-pay | Admitting: Family Medicine

## 2012-02-03 ENCOUNTER — Telehealth: Payer: Self-pay | Admitting: Family Medicine

## 2012-02-03 VITALS — BP 140/68 | HR 65 | Resp 18 | Wt 183.0 lb

## 2012-02-03 DIAGNOSIS — I10 Essential (primary) hypertension: Secondary | ICD-10-CM

## 2012-02-03 LAB — COMPLETE METABOLIC PANEL WITH GFR
Alkaline Phosphatase: 47 U/L (ref 39–117)
BUN: 21 mg/dL (ref 6–23)
GFR, Est Non African American: 59 mL/min — ABNORMAL LOW
Glucose, Bld: 106 mg/dL — ABNORMAL HIGH (ref 70–99)
Sodium: 143 mEq/L (ref 135–145)
Total Bilirubin: 0.5 mg/dL (ref 0.3–1.2)
Total Protein: 6.9 g/dL (ref 6.0–8.3)

## 2012-02-03 NOTE — Telephone Encounter (Signed)
Patient called and left a message for Marylene Land to call him back at [phone]. Thanks, [employer]

## 2012-02-03 NOTE — Patient Instructions (Addendum)
Call me with your medication list.  Then we can adjust your medications

## 2012-02-03 NOTE — Progress Notes (Signed)
  Subjective:    Patient ID: Derrick Cowan, male    DOB: 02-Jan-1941, 72 y.o.   MRN: [id number]  HPI HTN -  Pt denies chest pain, SOB, dizziness, or heart palpitations.  Taking meds w/o problems. He is very confused about his medications. He did not bring the bottles in with him today. He says most of them he has been cutting in half.   Denies medication side effects.     Chemistry      Component Value Date/Time   NA 140 09/30/2011 1024   K 4.1 09/30/2011 1024   CL 105 09/30/2011 1024   CO2 27 09/30/2011 1024   BUN 16 09/30/2011 1024   CREATININE 1.14 09/30/2011 1024   CREATININE 1.13 01/01/2010 2037      Component Value Date/Time   CALCIUM 9.6 09/30/2011 1024   ALKPHOS 48 05/21/2011 0942   AST 15 05/21/2011 0942   ALT 12 05/21/2011 0942   BILITOT 0.6 05/21/2011 0942        Review of Systems     Objective:   Physical Exam  Constitutional: He is oriented to person, place, and time. He appears well-developed and well-nourished.  HENT:  Head: Normocephalic and atraumatic.  Cardiovascular: Normal rate, regular rhythm and normal heart sounds.        2/6 SEM best heard on the right sternal border.   Pulmonary/Chest: Effort normal and breath sounds normal.  Musculoskeletal: He exhibits no edema.  Neurological: He is alert and oriented to person, place, and time.  Skin: Skin is warm and dry.  Psychiatric: He has a normal mood and affect. His behavior is normal.          Assessment & Plan:  HTN- Uncontrolled. Needs about 5 pts reduction.  He si very confused about his medication. Has gained about 6 lbs over the Holidays work on weight loss and diet.  Avoid salt products.  F/u in 1 months.

## 2012-02-04 NOTE — Progress Notes (Signed)
Quick Note:  All labs are normal. ______ 

## 2012-02-04 NOTE — Telephone Encounter (Signed)
I called patient and he states he was taking his blood pressure medication every other day, by mistake. He will now take as directed.

## 2012-03-09 ENCOUNTER — Encounter: Payer: Self-pay | Admitting: Family Medicine

## 2012-03-09 ENCOUNTER — Ambulatory Visit (INDEPENDENT_AMBULATORY_CARE_PROVIDER_SITE_OTHER): Payer: Medicare Other | Admitting: Family Medicine

## 2012-03-09 ENCOUNTER — Other Ambulatory Visit: Payer: Self-pay | Admitting: *Deleted

## 2012-03-09 VITALS — BP 169/73 | HR 61 | Ht 68.0 in | Wt 184.0 lb

## 2012-03-09 DIAGNOSIS — E663 Overweight: Secondary | ICD-10-CM | POA: Insufficient documentation

## 2012-03-09 DIAGNOSIS — I350 Nonrheumatic aortic (valve) stenosis: Secondary | ICD-10-CM | POA: Insufficient documentation

## 2012-03-09 MED ORDER — DILTIAZEM HCL ER COATED BEADS 120 MG PO CP24: 120.0000 mg | ORAL_CAPSULE | Freq: Every day | ORAL | Status: DC

## 2012-03-09 MED ORDER — DILTIAZEM HCL ER COATED BEADS 180 MG PO CP24: 180.0000 mg | ORAL_CAPSULE | Freq: Every day | ORAL | Status: DC

## 2012-03-09 MED ORDER — LISINOPRIL 40 MG PO TABS: 20.0000 mg | ORAL_TABLET | Freq: Every day | ORAL | Status: DC

## 2012-03-09 NOTE — Progress Notes (Signed)
  Subjective:    Patient ID: Derrick Cowan, male    DOB: [birth year], 72 y.o.   MRN: [id number]  HPI HTN - has gained about 7 llbs.  Hasn't been getting out as much.  Pt denies chest pain, SOB, dizziness, or heart palpitations.  Taking meds as directed w/o problems.  Denies medication side effects.      Review of Systems     Objective:   Physical Exam  Constitutional: He is oriented to person, place, and time. He appears well-developed and well-nourished.  HENT:  Head: Normocephalic and atraumatic.  Cardiovascular: Normal rate and regular rhythm.   2/6 SEM best heard at right sternal border.   Pulmonary/Chest: Effort normal and breath sounds normal.  Neurological: He is alert and oriented to person, place, and time.  Skin: Skin is warm and dry.  Psychiatric: He has a normal mood and affect. His behavior is normal.          Assessment & Plan:  HTN- Increaseing his cardizem to 180mg . F/Uin 8 weeks.  Has gained 7 lbs. Needs to work on getting this off.  Already eats a low salt diet. He is asymptomatic.   Overweight. Work on diet and exercise. Work on losing 7-10 lbs.

## 2012-03-09 NOTE — Telephone Encounter (Signed)
Med refill

## 2012-03-13 ENCOUNTER — Other Ambulatory Visit: Payer: Self-pay | Admitting: *Deleted

## 2012-03-13 MED ORDER — LISINOPRIL 20 MG PO TABS: 20.0000 mg | ORAL_TABLET | Freq: Every day | ORAL | Status: DC

## 2012-05-04 ENCOUNTER — Other Ambulatory Visit: Payer: Self-pay | Admitting: *Deleted

## 2012-05-04 ENCOUNTER — Encounter: Payer: Self-pay | Admitting: Family Medicine

## 2012-05-04 ENCOUNTER — Ambulatory Visit (INDEPENDENT_AMBULATORY_CARE_PROVIDER_SITE_OTHER): Payer: Medicare Other | Admitting: Family Medicine

## 2012-05-04 VITALS — BP 160/71 | HR 58 | Ht 68.0 in | Wt 180.0 lb

## 2012-05-04 DIAGNOSIS — G4733 Obstructive sleep apnea (adult) (pediatric): Secondary | ICD-10-CM

## 2012-05-04 DIAGNOSIS — I10 Essential (primary) hypertension: Secondary | ICD-10-CM

## 2012-05-04 MED ORDER — PRAVASTATIN SODIUM 40 MG PO TABS: 20.0000 mg | ORAL_TABLET | Freq: Every day | ORAL | Status: DC

## 2012-05-04 NOTE — Patient Instructions (Addendum)
Take a whole tab of lisinopril.

## 2012-05-04 NOTE — Progress Notes (Signed)
  Subjective:    Patient ID: Derrick Cowan, male    DOB: 09-Mar-1940, 72 y.o.   MRN: [id number]  HPI HTN- Pt denies chest pain, SOB, dizziness, or heart palpitations.  Taking meds as directed w/o problems.  Denies medication side effects. Has been walking daily and has lost 4lbs.  He just started the higher dose of cardizem this week.    OSA - Doesn't wear CPAP. Says just couldn't tolerate.    Denies any pain.   Review of Systems     Objective:   Physical Exam  Constitutional: He is oriented to person, place, and time. He appears well-developed and well-nourished.  HENT:  Head: Normocephalic and atraumatic.  Cardiovascular: Normal rate, regular rhythm and normal heart sounds.   Pulmonary/Chest: Effort normal and breath sounds normal.  Neurological: He is alert and oriented to person, place, and time.  Skin: Skin is warm and dry.  Psychiatric: He has a normal mood and affect. His behavior is normal.          Assessment & Plan:  HTN- Uncontrolled.  Make sure taking whole tab of lisinopril. He is currently only taking a half.  F/U in 6 weeks. Continue to work on weight loss. Discussed again the importance of controlled BP to lower his risk of stroke.     Chemistry      Component Value Date/Time   NA 143 02/03/2012 1053   K 4.1 02/03/2012 1053   CL 107 02/03/2012 1053   CO2 29 02/03/2012 1053   BUN 21 02/03/2012 1053   CREATININE 1.22 02/03/2012 1053   CREATININE 1.13 01/01/2010 2037      Component Value Date/Time   CALCIUM 9.7 02/03/2012 1053   ALKPHOS 47 02/03/2012 1053   AST 15 02/03/2012 1053   ALT 16 02/03/2012 1053   BILITOT 0.5 02/03/2012 1053      OSA - Untreated. He is not interested in working on this.

## 2012-05-07 ENCOUNTER — Other Ambulatory Visit: Payer: Self-pay | Admitting: *Deleted

## 2012-05-07 MED ORDER — LISINOPRIL 20 MG PO TABS: 20.0000 mg | ORAL_TABLET | Freq: Every day | ORAL | Status: DC

## 2012-05-07 MED ORDER — DILTIAZEM HCL ER COATED BEADS 180 MG PO CP24: 180.0000 mg | ORAL_CAPSULE | Freq: Every day | ORAL | Status: DC

## 2012-05-19 ENCOUNTER — Other Ambulatory Visit: Payer: Self-pay | Admitting: *Deleted

## 2012-05-19 MED ORDER — PRAVASTATIN SODIUM 40 MG PO TABS: 20.0000 mg | ORAL_TABLET | Freq: Every day | ORAL | Status: DC

## 2012-05-22 ENCOUNTER — Other Ambulatory Visit: Payer: Self-pay | Admitting: *Deleted

## 2012-05-22 MED ORDER — DILTIAZEM HCL ER COATED BEADS 180 MG PO CP24: 180.0000 mg | ORAL_CAPSULE | Freq: Every day | ORAL | Status: DC

## 2012-05-27 ENCOUNTER — Other Ambulatory Visit: Payer: Self-pay | Admitting: *Deleted

## 2012-05-27 MED ORDER — DILTIAZEM HCL ER COATED BEADS 180 MG PO CP24: 180.0000 mg | ORAL_CAPSULE | Freq: Every day | ORAL | Status: DC

## 2012-06-01 ENCOUNTER — Other Ambulatory Visit: Payer: Self-pay | Admitting: *Deleted

## 2012-06-01 MED ORDER — DILTIAZEM HCL ER COATED BEADS 180 MG PO CP24: 180.0000 mg | ORAL_CAPSULE | Freq: Every day | ORAL | Status: DC

## 2012-07-06 ENCOUNTER — Encounter: Payer: Self-pay | Admitting: Family Medicine

## 2012-07-06 ENCOUNTER — Ambulatory Visit (INDEPENDENT_AMBULATORY_CARE_PROVIDER_SITE_OTHER): Payer: Medicare Other | Admitting: Family Medicine

## 2012-07-06 VITALS — BP 128/70 | HR 59 | Wt 181.0 lb

## 2012-07-06 DIAGNOSIS — I10 Essential (primary) hypertension: Secondary | ICD-10-CM

## 2012-07-06 NOTE — Progress Notes (Signed)
  Subjective:    Patient ID: Derrick Cowan, male    DOB: 1940-01-18, 72 y.o.   MRN: [id number]  HPI HTN- eating low salt diet.  Walks for exercise.  No CP or SOB. Toleratoing whole tab of lisinpril well.   Review of Systems     Objective:   Physical Exam  Constitutional: He is oriented to person, place, and time. He appears well-developed and well-nourished.  HENT:  Head: Normocephalic and atraumatic.  Cardiovascular: Normal rate and regular rhythm.   Murmur heard. 2/6 SEM, high pitched on the right  Pulmonary/Chest: Effort normal and breath sounds normal.  Neurological: He is alert and oriented to person, place, and time.  Skin: Skin is warm and dry.  Psychiatric: He has a normal mood and affect. His behavior is normal.          Assessment & Plan:  HTN- Well controlled !!!!!.  F/u in 4-5 months.  Continue current regimen.

## 2012-09-09 ENCOUNTER — Ambulatory Visit (INDEPENDENT_AMBULATORY_CARE_PROVIDER_SITE_OTHER): Payer: Medicare Other | Admitting: Physician Assistant

## 2012-09-09 ENCOUNTER — Encounter: Payer: Self-pay | Admitting: Physician Assistant

## 2012-09-09 VITALS — BP 149/80 | HR 71 | Wt 181.0 lb

## 2012-09-09 DIAGNOSIS — B029 Zoster without complications: Secondary | ICD-10-CM

## 2012-09-09 DIAGNOSIS — R0609 Other forms of dyspnea: Secondary | ICD-10-CM

## 2012-09-09 DIAGNOSIS — D239 Other benign neoplasm of skin, unspecified: Secondary | ICD-10-CM

## 2012-09-09 DIAGNOSIS — R011 Cardiac murmur, unspecified: Secondary | ICD-10-CM

## 2012-09-09 DIAGNOSIS — R0989 Other specified symptoms and signs involving the circulatory and respiratory systems: Secondary | ICD-10-CM

## 2012-09-09 DIAGNOSIS — D229 Melanocytic nevi, unspecified: Secondary | ICD-10-CM

## 2012-09-09 DIAGNOSIS — R0600 Dyspnea, unspecified: Secondary | ICD-10-CM

## 2012-09-09 MED ORDER — VALACYCLOVIR HCL 1 G PO TABS: 1000.0000 mg | ORAL_TABLET | Freq: Three times a day (TID) | ORAL | Status: DC

## 2012-09-09 NOTE — Progress Notes (Signed)
  Subjective:    Patient ID: Derrick Cowan, male    DOB: 1940/03/05, 72 y.o.   MRN: [id number]  HPI Patient is a 72 year old male who presents to the clinic with his wife for a rash on his left arm from his wrist to his left shoulder. Rash started about 3 or 4 days ago. He denies any pain or itching. It started on his wrist and has progressed up his arm to his left shoulder. There is vesicle formation and has been draining some clear fluid. He denies any stressful situation or recent illness. His wife reports that he has been more short of breath lately and acting more fatigue. He denies any fever, chills, nausea, vomiting. He has tried triamcinolone cream that his wife has to make rash better and it does not make better or worse. He has not had the shingles vaccine.   Review of Systems     Objective:   Physical Exam  Constitutional: He is oriented to person, place, and time. He appears well-developed and well-nourished.  HENT:  Head: Normocephalic and atraumatic.  Cardiovascular: Normal rate and regular rhythm.   Murmur heard. 3/6 SEM on right and left.   Pulmonary/Chest: Effort normal and breath sounds normal. He has no wheezes.  Neurological: He is alert and oriented to person, place, and time.  Skin:  Multiple patches of erythema with vesicles on top from wrist up to shoulder on left arm.   Atypical mole on left shoulder very dark with irregular borders. Pt never noticed before.   Psychiatric: He has a normal mood and affect. His behavior is normal.          Assessment & Plan:  Shingles-it is certainly a surprise that he has this extent of rash and no pain. I will treat with Valtrex for the next 7 days. Patient told to call office if pain starts or symptoms worsen. Culture of the vesicle was taken and sent to lab. Will call patient with results. Discussed symptomatic care for shingles and gave handout.  Her murmur/dyspnea- I suggest we ordered a echo to reevaluate murmur to make  sure ejection fraction is still good and not causing SOB. From previous Dr. Linford Arnold notes it seems to be getting louder. Per patient not had echo done in many years.  atypical mole- pt never noticed. Discussed with patient this could be a dangerous normal. Once rash has resolved instructed patient to come back to clinic and have Dr. Glade Lloyd or myself chaise mole off for biopsy.

## 2012-09-09 NOTE — Patient Instructions (Addendum)
Come back for atypical nevus removal. Will order echo.   Shingles Shingles (herpes zoster) is an infection that is caused by the same virus that causes chickenpox (varicella). The infection causes a painful skin rash and fluid-filled blisters, which eventually break open, crust over, and heal. It may occur in any area of the body, but it usually affects only one side of the body or face. The pain of shingles usually lasts about 1 month. However, some people with shingles may develop long-term (chronic) pain in the affected area of the body. Shingles often occurs many years after the person had chickenpox. It is more common:  In people older than 50 years.  In people with weakened immune systems, such as those with HIV, AIDS, or cancer.  In people taking medicines that weaken the immune system, such as transplant medicines.  In people under great stress. CAUSES  Shingles is caused by the varicella zoster virus (VZV), which also causes chickenpox. After a person is infected with the virus, it can remain in the person's body for years in an inactive state (dormant). To cause shingles, the virus reactivates and breaks out as an infection in a nerve root. The virus can be spread from person to person (contagious) through contact with open blisters of the shingles rash. It will only spread to people who have not had chickenpox. When these people are exposed to the virus, they may develop chickenpox. They will not develop shingles. Once the blisters scab over, the person is no longer contagious and cannot spread the virus to others. SYMPTOMS  Shingles shows up in stages. The initial symptoms may be pain, itching, and tingling in an area of the skin. This pain is usually described as burning, stabbing, or throbbing.In a few days or weeks, a painful red rash will appear in the area where the pain, itching, and tingling were felt. The rash is usually on one side of the body in a band or belt-like pattern.  Then, the rash usually turns into fluid-filled blisters. They will scab over and dry up in approximately 2 3 weeks. Flu-like symptoms may also occur with the initial symptoms, the rash, or the blisters. These may include:  Fever.  Chills.  Headache.  Upset stomach. DIAGNOSIS  Your caregiver will perform a skin exam to diagnose shingles. Skin scrapings or fluid samples may also be taken from the blisters. This sample will be examined under a microscope or sent to a lab for further testing. TREATMENT  There is no specific cure for shingles. Your caregiver will likely prescribe medicines to help you manage the pain, recover faster, and avoid long-term problems. This may include antiviral drugs, anti-inflammatory drugs, and pain medicines. HOME CARE INSTRUCTIONS   Take a cool bath or apply cool compresses to the area of the rash or blisters as directed. This may help with the pain and itching.   Only take over-the-counter or prescription medicines as directed by your caregiver.   Rest as directed by your caregiver.  Keep your rash and blisters clean with mild soap and cool water or as directed by your caregiver.  Do not pick your blisters or scratch your rash. Apply an anti-itch cream or numbing creams to the affected area as directed by your caregiver.  Keep your shingles rash covered with a loose bandage (dressing).  Avoid skin contact with:  Babies.   Pregnant women.   Children with eczema.   Elderly people with transplants.   People with chronic illnesses, such as  leukemia or AIDS.   Wear loose-fitting clothing to help ease the pain of material rubbing against the rash.  Keep all follow-up appointments with your caregiver.If the area involved is on your face, you may receive a referral for follow-up to a specialist, such as an eye doctor (ophthalmologist) or an ear, nose, and throat (ENT) doctor. Keeping all follow-up appointments will help you avoid eye  complications, chronic pain, or disability.  SEEK IMMEDIATE MEDICAL CARE IF:   You have facial pain, pain around the eye area, or loss of feeling on one side of your face.  You have ear pain or ringing in your ear.  You have loss of taste.  Your pain is not relieved with prescribed medicines.   Your redness or swelling spreads.   You have more pain and swelling.  Your condition is worsening or has changed.   You have a feveror persistent symptoms for more than 2 3 days.  You have a fever and your symptoms suddenly get worse. MAKE SURE YOU:  Understand these instructions.  Will watch your condition.  Will get help right away if you are not doing well or get worse. Document Released: 12/31/2004 Document Revised: 09/25/2011 Document Reviewed: 08/15/2011 [facility] Patient Information 2014 [place], [place].

## 2012-09-21 LAB — VIRAL CULTURE VIRC: Organism ID, Bacteria: DETECTED

## 2012-10-29 ENCOUNTER — Other Ambulatory Visit: Payer: Self-pay | Admitting: *Deleted

## 2012-10-29 MED ORDER — HYDROCHLOROTHIAZIDE 25 MG PO TABS: 12.5000 mg | ORAL_TABLET | Freq: Every day | ORAL | Status: DC

## 2012-11-02 ENCOUNTER — Telehealth: Payer: Self-pay | Admitting: *Deleted

## 2012-11-02 NOTE — Telephone Encounter (Signed)
Pt was calling about hctz. I told him it was sent last week.Marland KitchenMarland KitchenMarland KitchenLoralee Pacas [place]

## 2012-11-02 NOTE — Telephone Encounter (Signed)
Pt called and lvm for return call. I called back and had to leave message for him to return my call.Loralee Pacas [place]

## 2012-11-04 ENCOUNTER — Other Ambulatory Visit: Payer: Self-pay | Admitting: *Deleted

## 2012-11-04 MED ORDER — LISINOPRIL 20 MG PO TABS: 20.0000 mg | ORAL_TABLET | Freq: Every day | ORAL | Status: DC

## 2012-11-09 ENCOUNTER — Encounter: Payer: Self-pay | Admitting: Family Medicine

## 2012-11-09 ENCOUNTER — Ambulatory Visit (INDEPENDENT_AMBULATORY_CARE_PROVIDER_SITE_OTHER): Payer: Medicare Other | Admitting: Family Medicine

## 2012-11-09 VITALS — BP 159/75 | HR 66 | Wt 176.0 lb

## 2012-11-09 DIAGNOSIS — D229 Melanocytic nevi, unspecified: Secondary | ICD-10-CM

## 2012-11-09 DIAGNOSIS — D239 Other benign neoplasm of skin, unspecified: Secondary | ICD-10-CM

## 2012-11-09 DIAGNOSIS — E785 Hyperlipidemia, unspecified: Secondary | ICD-10-CM

## 2012-11-09 DIAGNOSIS — H409 Unspecified glaucoma: Secondary | ICD-10-CM | POA: Insufficient documentation

## 2012-11-09 DIAGNOSIS — I10 Essential (primary) hypertension: Secondary | ICD-10-CM

## 2012-11-09 DIAGNOSIS — R7301 Impaired fasting glucose: Secondary | ICD-10-CM

## 2012-11-09 DIAGNOSIS — Z23 Encounter for immunization: Secondary | ICD-10-CM

## 2012-11-09 LAB — POCT GLYCOSYLATED HEMOGLOBIN (HGB A1C): Hemoglobin A1C: 5.5

## 2012-11-09 MED ORDER — LISINOPRIL 20 MG PO TABS: 20.0000 mg | ORAL_TABLET | Freq: Every day | ORAL | Status: DC

## 2012-11-09 MED ORDER — LISINOPRIL 40 MG PO TABS: 40.0000 mg | ORAL_TABLET | Freq: Every day | ORAL | Status: DC

## 2012-11-09 MED ORDER — DILTIAZEM HCL ER COATED BEADS 180 MG PO CP24: 180.0000 mg | ORAL_CAPSULE | Freq: Every day | ORAL | Status: DC

## 2012-11-09 NOTE — Progress Notes (Signed)
Subjective:    Patient ID: Derrick Cowan, male    DOB: Jul 02, 1940, 72 y.o.   MRN: [id number]  HPI HTN-  Pt denies chest pain, SOB, dizziness, or heart palpitations.  Taking meds as directed w/o problems.  Denies medication side effects. The diltiazem make him quiet and then more irritable, and his wife would like [place] to change this. She notices a direct correlation to when he takes it it also to his irritability. He seems to be tolerating her medications well. He does have dementia.  Hyprlipidemia-He is not fasitng today.  Due for lipids today. No myalgias or S.E  On medication  Impaired fasting glucose-no wounds that are not healing.  No neuropathy sxs.    Increased urinary frequeny - Stable. No chang in Sxs.   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 He's not sure how long it's been there. He says it does not bother him. Itching or irritation or tenderness. It was noticed by another provider when he had an outbreak of shingles. His wife is not sure how long it's been there either.   Review of Systems  BP 159/75  Pulse 66  Wt 176 lb (79.833 kg)  BMI 26.77 kg/m2  SpO2 99%    Allergies  Allergen Reactions  . Amlodipine Other (See Comments)    Irritable and cranky    Past Medical History  Diagnosis Date  . Stroke     2002  . Heart murmur   . Hypertension   . Type II diabetes mellitus   . OSA (obstructive sleep apnea)     not using CPAP    No past surgical history on file.  History   Social History  . Marital Status: Married    Spouse Name: N/A    Number of Children: N/A  . Years of Education: N/A   Occupational History  . Not on file.   Social History Main Topics  . Smoking status: Never Smoker   . Smokeless tobacco: Not on file  . Alcohol Use: No  . Drug Use: No  . Sexual Activity: Not Currently   Other Topics Concern  . Not on file   Social History Narrative  . No narrative on file    Family History  Problem Relation Age of Onset  . Heart disease  Other     family hx of    Outpatient Encounter Prescriptions as of 11/09/2012  Medication Sig Dispense Refill  . aspirin 81 MG tablet Take 81 mg by mouth daily.        . bimatoprost (LUMIGAN) 0.03 % ophthalmic drops 1 drop daily.        . brimonidine (ALPHAGAN P) 0.1 % SOLN        . diltiazem (CARDIZEM CD) 180 MG 24 hr capsule Take 1 capsule (180 mg total) by mouth daily.  1 capsule  0  . hydrochlorothiazide (HYDRODIURIL) 25 MG tablet Take 0.5 tablets (12.5 mg total) by mouth daily.  35 tablet  2  . lisinopril (PRINIVIL,ZESTRIL) 40 MG tablet Take 1 tablet (40 mg total) by mouth daily.  90 tablet  1  . pravastatin (PRAVACHOL) 40 MG tablet Take 0.5 tablets (20 mg total) by mouth at bedtime.  90 tablet  1  . valACYclovir (VALTREX) 1000 MG tablet Take 1 tablet (1,000 mg total) by mouth 3 (three) times daily. For 7 days.  21 tablet  0  . [DISCONTINUED] diltiazem (CARDIZEM CD) 180 MG 24 hr capsule Take 1 capsule (180 mg total) by  mouth daily.  90 capsule  2  . [DISCONTINUED] lisinopril (PRINIVIL,ZESTRIL) 20 MG tablet Take 1 tablet (20 mg total) by mouth daily.  90 tablet  1   No facility-administered encounter medications on file as of 11/09/2012.          Objective:   Physical Exam  Constitutional: He is oriented to person, place, and time. He appears well-developed and well-nourished.  HENT:  Head: Normocephalic and atraumatic.  Cardiovascular: Normal rate, regular rhythm and normal heart sounds.   Pulmonary/Chest: Effort normal and breath sounds normal.  Neurological: He is alert and oriented to person, place, and time.  Skin: Skin is warm and dry.  He has a very darkly pigmented for the Clover-shaped mole on the left shoulder, approx 1 cm.   Psychiatric: He has a normal mood and affect. His behavior is normal.          Assessment & Plan:  HTN- uncontrolled.  We discussed changing to metoprolol. He says already has 2 bottles of diltiazem at home and he is not interested in  changing it. He says he does not have any problems with medications, it is his wife that has issues and feels it's causing some mood change. He was very resistant to the change in the room. Thus we will leave it be.  I was going to increase his lisinopril to 40 mg but then he said he was actually only taking a half of a tab of the 20 mg dose. Encouraged him to start taking a whole tab embolic back up in 4-6 weeks to recheck his blood pressure.  We'll continue hydrochlorothiazide. Due to recheck BMP.   Hyperlipidemia-due for CMP and fasting lipid panel. Has been over a year. Continue pravastatin for now.   impaired fasting glucose- Since his been so well controlled the last 2 times.ed fasting glucose- A1c was 5.5 this time. Repeat in 1 year.    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 Person to schedule that later this week or next week if possible.  Flu vaccine given today. His wife received hers as well.

## 2012-11-09 NOTE — Patient Instructions (Signed)
Schedule biopsy of atypical mole.

## 2012-11-10 LAB — COMPLETE METABOLIC PANEL WITH GFR
ALT: 12 U/L (ref 0–53)
AST: 15 U/L (ref 0–37)
Albumin: 4 g/dL (ref 3.5–5.2)
Alkaline Phosphatase: 61 U/L (ref 39–117)
Potassium: 4.7 mEq/L (ref 3.5–5.3)
Sodium: 139 mEq/L (ref 135–145)
Total Bilirubin: 0.7 mg/dL (ref 0.3–1.2)
Total Protein: 6.8 g/dL (ref 6.0–8.3)

## 2012-11-10 LAB — LIPID PANEL
HDL: 39 mg/dL — ABNORMAL LOW (ref >39)
Total CHOL/HDL Ratio: 3.5 Ratio
VLDL: 16 mg/dL (ref 0–40)

## 2012-11-18 ENCOUNTER — Ambulatory Visit (INDEPENDENT_AMBULATORY_CARE_PROVIDER_SITE_OTHER): Payer: Medicare Other | Admitting: Family Medicine

## 2012-11-18 ENCOUNTER — Encounter: Payer: Self-pay | Admitting: Family Medicine

## 2012-11-18 VITALS — BP 132/70 | HR 72 | Temp 98.1°F | Wt 180.0 lb

## 2012-11-18 DIAGNOSIS — D239 Other benign neoplasm of skin, unspecified: Secondary | ICD-10-CM

## 2012-11-18 DIAGNOSIS — D229 Melanocytic nevi, unspecified: Secondary | ICD-10-CM

## 2012-11-18 NOTE — Progress Notes (Signed)
  Subjective:    Patient ID: Derrick Cowan, male    DOB: 09-27-1940, 72 y.o.   MRN: [id number]  HPI Here to have excision of atypical small performed. The mole is on the left upper shoulder. He's not really sure how long it's been there for at least several months. His wife had not noticed it before. He says it does not bother him. It is not tender or itchy or irritated.   Review of Systems     Objective:   Physical Exam  Constitutional: He appears well-developed and well-nourished.  HENT:  Head: Normocephalic and atraumatic.  Skin: Skin is warm and dry.  Irregularly shaped almost cloverleaf like darkly pigmented lesion on the left upper shoulder. It measures approximately 5 x 7 mm.  Psychiatric: He has a normal mood and affect. His behavior is normal.          Assessment & Plan:   atypical nevus-patient tolerated the excision well. The pathology report should be back in about a week and we will call him at that time. It and written wound care instructions please follow up in 7-10 days to have the sutures removed. Preferably closer to the 10 day mark if possible. Call if any signs of infection.    Excisional Biopsy Procedure Note  Pre-operative Diagnosis: Suspicious lesion  Post-operative Diagnosis: same  Locations: left upper shoulder  Indications: atypical, worrisome appearance  Anesthesia: Lidocaine 1% with epinephrine without added sodium bicarbonate  Procedure Details  The risks, benefits, indications, potential complications, and alternatives were explained to the patient and informed consent obtained.  The lesion and surrounding area was given a sterile prep using betadyne and draped in the usual sterile fashion. A scalpel was used to excise an elliptical area of skin (full skin thickness excision) approximately 1cm by 1.5cm. The wound was closed with 4-0 Ethilon using simple interrupted stitches x 3. Antibiotic ointment and a sterile dressing applied. The specimen  was sent for pathologic examination. The patient tolerated the procedure well.  EBL: minimal  Findings: await pathology  Condition: Stable  Complications:  None  Plan: 1. Instructed to keep the wound dry and covered for 24-48 hours and clean thereafter. 2. Warning signs of infection were reviewed.   3. Recommended that the patient use OTC acetaminophen as needed for pain.  4. Return for suture removal in 7-10 days.

## 2012-11-18 NOTE — Patient Instructions (Signed)
Please find the gym place for 24 hours. After that he can remove the bandage after that.   Ok to get wet in the shower after 24 hours. Do not scrub the area. Had dry. Do not apply any type of alcohol or peroxide products. Please apply a little bit of Neosporin or Vaseline to the wound daily. Followup in 8-10 days to have the sutures removed. It takes about a week to get the pathology report back. We will call you as soon as we get the report.

## 2012-11-23 ENCOUNTER — Other Ambulatory Visit: Payer: Self-pay | Admitting: Family Medicine

## 2012-11-23 DIAGNOSIS — D229 Melanocytic nevi, unspecified: Secondary | ICD-10-CM

## 2012-11-26 ENCOUNTER — Ambulatory Visit: Payer: Medicare Other | Admitting: Family Medicine

## 2012-11-26 ENCOUNTER — Encounter: Payer: Self-pay | Admitting: *Deleted

## 2012-11-26 VITALS — BP 148/68 | HR 64

## 2012-11-26 DIAGNOSIS — Z4802 Encounter for removal of sutures: Secondary | ICD-10-CM

## 2012-11-26 NOTE — Progress Notes (Signed)
  Subjective:    Patient ID: Derrick Cowan, male    DOB: 08-07-1940, 72 y.o.   MRN: [id number] 3 sutures removed from upper left arm. Wound healing nicely.  Donne Anon, CMA HPI    Review of Systems     Objective:   Physical Exam        Assessment & Plan:

## 2012-12-18 ENCOUNTER — Ambulatory Visit: Payer: Medicare Other | Admitting: Family Medicine

## 2012-12-30 ENCOUNTER — Encounter: Payer: Self-pay | Admitting: Family Medicine

## 2012-12-30 ENCOUNTER — Ambulatory Visit (INDEPENDENT_AMBULATORY_CARE_PROVIDER_SITE_OTHER): Payer: Medicare Other | Admitting: Family Medicine

## 2012-12-30 VITALS — BP 132/72 | HR 75 | Temp 97.2°F | Wt 180.0 lb

## 2012-12-30 DIAGNOSIS — I10 Essential (primary) hypertension: Secondary | ICD-10-CM

## 2012-12-30 DIAGNOSIS — R011 Cardiac murmur, unspecified: Secondary | ICD-10-CM

## 2012-12-30 NOTE — Progress Notes (Signed)
   Subjective:    Patient ID: Derrick Cowan, male    DOB: 1940-10-29, 72 y.o.   MRN: [id number]  HPI HTN-  Pt denies chest pain, SOB, dizziness, or heart palpitations.  Taking meds as directed w/o problems.  Denies medication side effects.   He did increase his lisinopril to whole tab. He previously been splitting it and I was unaware. Now he's got to whole tab he says he is tolerating it well and not having any palms or difficulty.   Review of Systems     Objective:   Physical Exam  Constitutional: He is oriented to person, place, and time. He appears well-developed and well-nourished.  HENT:  Head: Normocephalic and atraumatic.  Cardiovascular: Normal rate, regular rhythm and normal heart sounds.   Harsh, sharp murmur best heard at the right and left sternal border.  Loudness is equal.   Pulmonary/Chest: Effort normal and breath sounds normal.  Neurological: He is alert and oriented to person, place, and time.  Skin: Skin is warm and dry.  Psychiatric: He has a normal mood and affect. His behavior is normal.          Assessment & Plan:  Hypertension-blood pressure looks absolutely fantastic today. Continue current regimen. Followup in 4 months. We'll check a BMP today.  Heart murmur-will check on echo. Was evidently ordered in August but he never received a phone call and it was not performed. He does have a very harsh and sharp murmur that I think needs to be evaluated further with echocardiogram.

## 2012-12-31 LAB — BASIC METABOLIC PANEL WITH GFR
BUN: 15 mg/dL (ref 6–23)
CO2: 32 mEq/L (ref 19–32)
Calcium: 9.2 mg/dL (ref 8.4–10.5)
Chloride: 101 mEq/L (ref 96–112)
Creat: 1.08 mg/dL (ref 0.50–1.35)
Glucose, Bld: 91 mg/dL (ref 70–99)

## 2012-12-31 NOTE — Progress Notes (Signed)
Quick Note:  All labs are normal. ______ 

## 2013-01-20 ENCOUNTER — Ambulatory Visit (HOSPITAL_BASED_OUTPATIENT_CLINIC_OR_DEPARTMENT_OTHER): Payer: Medicare Other

## 2013-02-24 ENCOUNTER — Ambulatory Visit (HOSPITAL_BASED_OUTPATIENT_CLINIC_OR_DEPARTMENT_OTHER): Payer: Medicare Other

## 2013-03-03 ENCOUNTER — Ambulatory Visit (HOSPITAL_BASED_OUTPATIENT_CLINIC_OR_DEPARTMENT_OTHER): Payer: Medicare Other

## 2013-03-08 ENCOUNTER — Other Ambulatory Visit: Payer: Self-pay | Admitting: *Deleted

## 2013-03-08 MED ORDER — LISINOPRIL 20 MG PO TABS: 20.0000 mg | ORAL_TABLET | Freq: Every day | ORAL | Status: DC

## 2013-03-08 MED ORDER — HYDROCHLOROTHIAZIDE 25 MG PO TABS: 12.5000 mg | ORAL_TABLET | Freq: Every day | ORAL | Status: DC

## 2013-03-08 MED ORDER — DILTIAZEM HCL ER COATED BEADS 180 MG PO CP24: 180.0000 mg | ORAL_CAPSULE | Freq: Every day | ORAL | Status: DC

## 2013-03-08 MED ORDER — PRAVASTATIN SODIUM 40 MG PO TABS: 20.0000 mg | ORAL_TABLET | Freq: Every day | ORAL | Status: DC

## 2013-03-24 ENCOUNTER — Encounter: Payer: Medicare Other | Admitting: Family Medicine

## 2013-04-07 ENCOUNTER — Ambulatory Visit (HOSPITAL_BASED_OUTPATIENT_CLINIC_OR_DEPARTMENT_OTHER)
Admission: RE | Admit: 2013-04-07 | Discharge: 2013-04-07 | Disposition: A | Discharge status: Home / Self Care | Payer: Commercial Managed Care - HMO | Source: Ambulatory Visit | Attending: Family Medicine | Admitting: Family Medicine

## 2013-04-07 DIAGNOSIS — R0609 Other forms of dyspnea: Secondary | ICD-10-CM | POA: Insufficient documentation

## 2013-04-07 DIAGNOSIS — I359 Nonrheumatic aortic valve disorder, unspecified: Secondary | ICD-10-CM

## 2013-04-07 DIAGNOSIS — R011 Cardiac murmur, unspecified: Secondary | ICD-10-CM | POA: Diagnosis present

## 2013-04-07 DIAGNOSIS — R0989 Other specified symptoms and signs involving the circulatory and respiratory systems: Secondary | ICD-10-CM | POA: Insufficient documentation

## 2013-04-07 DIAGNOSIS — R0600 Dyspnea, unspecified: Secondary | ICD-10-CM

## 2013-04-07 NOTE — Progress Notes (Signed)
  Echocardiogram 2D Echocardiogram has been performed.  Derrick Cowan [name] 04/07/2013, 11:23 AM

## 2013-04-12 ENCOUNTER — Encounter: Payer: Self-pay | Admitting: Physician Assistant

## 2013-04-30 ENCOUNTER — Encounter: Payer: Self-pay | Admitting: Family Medicine

## 2013-04-30 ENCOUNTER — Ambulatory Visit (INDEPENDENT_AMBULATORY_CARE_PROVIDER_SITE_OTHER): Payer: Commercial Managed Care - HMO | Admitting: Family Medicine

## 2013-04-30 VITALS — BP 142/78 | HR 68 | Ht 68.0 in | Wt 176.0 lb

## 2013-04-30 DIAGNOSIS — R413 Other amnesia: Secondary | ICD-10-CM

## 2013-04-30 DIAGNOSIS — Z0000 Encounter for general adult medical examination without abnormal findings: Secondary | ICD-10-CM

## 2013-04-30 DIAGNOSIS — R3989 Other symptoms and signs involving the genitourinary system: Secondary | ICD-10-CM

## 2013-04-30 DIAGNOSIS — R9389 Abnormal findings on diagnostic imaging of other specified body structures: Secondary | ICD-10-CM

## 2013-04-30 DIAGNOSIS — E8881 Metabolic syndrome: Secondary | ICD-10-CM

## 2013-04-30 DIAGNOSIS — R972 Elevated prostate specific antigen [PSA]: Secondary | ICD-10-CM

## 2013-04-30 LAB — POCT GLYCOSYLATED HEMOGLOBIN (HGB A1C): Hemoglobin A1C: 6

## 2013-04-30 NOTE — Progress Notes (Signed)
Subjective:    Derrick Cowan is a 73 y.o. male who presents for Medicare Annual/Subsequent preventive examination.   Preventive Screening-Counseling & Management  Tobacco History  Smoking status  . Never Smoker   Smokeless tobacco  . Not on file    Problems Prior to Visit 1. None.  Current Problems (verified) Patient Active Problem List   Diagnosis Date Noted  . Glaucoma 11/09/2012  . Heart murmur 03/09/2012  . Overweight 03/09/2012  . 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 URINARY FREQUENCY 10/28/2007  . SLEEP APNEA, OBSTRUCTIVE 04/27/2007  . HYPERLIPIDEMIA 03/23/2007  . ESSENTIAL HYPERTENSION, BENIGN 03/23/2007  . CVA 03/23/2007    Medications Prior to Visit Current Outpatient Prescriptions on File Prior to Visit  Medication Sig Dispense Refill  . aspirin 81 MG tablet Take 81 mg by mouth daily.        . bimatoprost (LUMIGAN) 0.03 % ophthalmic drops 1 drop daily.        . brimonidine (ALPHAGAN P) 0.1 % SOLN        . diltiazem (CARDIZEM CD) 180 MG 24 hr capsule Take 1 capsule (180 mg total) by mouth daily.  90 capsule  0  . hydrochlorothiazide (HYDRODIURIL) 25 MG tablet Take 0.5 tablets (12.5 mg total) by mouth daily.  90 tablet  2  . lisinopril (PRINIVIL,ZESTRIL) 20 MG tablet Take 1 tablet (20 mg total) by mouth daily.  90 tablet  0  . pravastatin (PRAVACHOL) 40 MG tablet Take 0.5 tablets (20 mg total) by mouth at bedtime.  90 tablet  1   No current facility-administered medications on file prior to visit.    Current Medications (verified) Current Outpatient Prescriptions  Medication Sig Dispense Refill  . aspirin 81 MG tablet Take 81 mg by mouth daily.        . bimatoprost (LUMIGAN) 0.03 % ophthalmic drops 1 drop daily.        . brimonidine (ALPHAGAN P) 0.1 % SOLN        . diltiazem (CARDIZEM CD) 180 MG 24 hr capsule Take 1 capsule (180 mg total) by mouth daily.  90 capsule  0  . hydrochlorothiazide (HYDRODIURIL) 25 MG tablet Take 0.5 tablets (12.5 mg total) by  mouth daily.  90 tablet  2  . lisinopril (PRINIVIL,ZESTRIL) 20 MG tablet Take 1 tablet (20 mg total) by mouth daily.  90 tablet  0  . pravastatin (PRAVACHOL) 40 MG tablet Take 0.5 tablets (20 mg total) by mouth at bedtime.  90 tablet  1   No current facility-administered medications for this visit.     Allergies (verified) Amlodipine   PAST HISTORY  Family History Family History  Problem Relation Age of Onset  . Heart disease Other     family hx of    Social History History  Substance Use Topics  . Smoking status: Never Smoker   . Smokeless tobacco: Not on file  . Alcohol Use: No    Are there smokers in your home (other than you)?  No  Risk Factors Current exercise habits: The patient does not participate in regular exercise at present.  Dietary issues discussed: none  Cardiac risk factors: advanced age (older than 78 for men, 65 for women), male gender and sedentary lifestyle.none  Depression Screen (Note: if answer to either of the following is "Yes", a more complete depression screening is indicated)   Q1: Over the past two weeks, have you felt down, depressed or hopeless? No  Q2: Over the past two weeks, have you  felt little interest or pleasure in doing things? No  Have you lost interest or pleasure in daily life? No  Do you often feel hopeless? No  Do you cry easily over simple problems? No  Activities of Daily Living In your present state of health, do you have any difficulty performing the following activities?:  Driving? No Managing money?  No  Feeding yourself? No Getting from bed to chair? No Climbing a flight of stairs? No Preparing food and eating?: No Bathing or showering? No Getting dressed: No Getting to the toilet? No Using the toilet:No Moving around from place to place: No In the past year have you fallen or had a near fall?:No   Are you sexually active?  No  Do you have more than one partner?  No  Hearing Difficulties: No Do you often  ask people to speak up or repeat themselves? No Do you experience ringing or noises in your ears? No Do you have difficulty understanding soft or whispered voices? No   Do you feel that you have a problem with memory? No  Do you often misplace items? No  Do you feel safe at home?  Yes  Cognitive Testing  Alert? Yes  Normal Appearance?Yes  Oriented to person? Yes  Place? Yes      Advanced Directives have been discussed with the patient? Yes   List the Names of Other Physician/Practitioners you currently use: 1.    Indicate any recent Medical Services you may have received from other than [facility] providers in the past year (date may be approximate).  Immunization History  Administered Date(s) Administered  . Influenza Split 10/12/2010, 09/30/2011  . Influenza Whole 11/25/2008, 01/01/2010  . Influenza,inj,Quad PF,36+ Mos 11/09/2012  . Pneumococcal Polysaccharide-23 04/12/2008  . Td 03/23/2007  . Zoster 01/11/2013    Screening Tests Health Maintenance  Topic Date Due  . Influenza Vaccine  08/14/2013  . Colonoscopy  01/14/2014  . Tetanus/tdap  03/22/2017  . Pneumococcal Polysaccharide Vaccine Age 69 And Over  Completed  . Zostavax  Addressed    All answers were reviewed with the patient and necessary referrals were made:  Lakera Viall, MD   04/30/2013   History reviewed: allergies, current medications, past family history, past medical history, past social history, past surgical history and problem list  Review of Systems A comprehensive review of systems was negative.    Objective:     Vision by Snellen chart: Will call to get copy of last eye exam.  Blood pressure 175/78, pulse 69, height 5\' 8"  (1.727 m), weight 176 lb (79.833 kg). Body mass index is 26.77 kg/(m^2).  BP 142/78  Pulse 68  Ht 5\' 8"  (1.727 m)  Wt 176 lb (79.833 kg)  BMI 26.77 kg/m2  SpO2 98%  General Appearance:    Alert, cooperative, no distress, appears stated age  Head:     Normocephalic, without obvious abnormality, atraumatic  Eyes:    PERRL, conjunctiva/corneas clear, EOM's intact, both eyes       Ears:    Normal TM's and external ear canals, both ears  Nose:   Nares normal, septum midline, mucosa normal, no drainage    or sinus tenderness  Throat:   Lips, mucosa, and tongue normal; teeth and gums normal  Neck:   Supple, symmetrical, trachea midline, no adenopathy;       thyroid:  No enlargement/tenderness/nodules; no carotid   bruit or JVD  Back:     Symmetric, no curvature, ROM normal, no CVA tenderness  Lungs:     Clear to auscultation bilaterally, respirations unlabored  Chest wall:    No tenderness or deformity  Heart:    Regular rate and rhythm, S1 and S2 normal, no murmur, rub   or gallop  Abdomen:     Soft, non-tender, bowel sounds active all four quadrants,    no masses, no organomegaly  Genitalia:    Not peformed  Rectal:    Normal tone, prostate is very enlarged and has very firm irregular nodules on the right side.   Extremities:   Extremities normal, atraumatic, no cyanosis or edema  Pulses:   2+ and symmetric all extremities  Skin:   Skin color, texture, turgor normal, no rashes or lesions  Lymph nodes:   Cervical, supraclavicular, and axillary nodes normal  Neurologic:   CNII-XII intact. Normal strength, sensation and reflexes      throughout       Assessment:     Annual Medicare Wellness Exam      Plan:     During the course of the visit the patient was educated and counseled about appropriate screening and preventive services including:    Pneumococcal vaccine  - due for Prevnar based on new guidelines. Given handout to his he can check with insurance and coverage first.  History of elevated PSA-he did have an elevated PSA back in 2013. We attempted to call his wife's a couple of times and was unable to contact them. We also mailed a letter. His wife says she never received this. He does have an irregular prostate today. We'll  repeat PSA. I will go ahead and place a referral to urology. Exam concerning for prostate cancer.  Lifestyle changes-is very sedentary. Encourage more regular exercise activity. Hopefully he will go to the [facility] to start exercising since they have joined the gym last week.  Aortic stenosis-encouraged him to make it back to a whole tab on his statin. He's been cutting it in half on his own. Encouraged him he really needs to be on a whole tab to be effective.  Diet review for nutrition referral? Yes ____  Not Indicated _X__   Patient Instructions (the written plan) was given to the patient.  Medicare Attestation I have personally reviewed: The patient's medical and social history Their use of alcohol, tobacco or illicit drugs Their current medications and supplements The patient's functional ability including ADLs,fall risks, home safety risks, cognitive, and hearing and visual impairment Diet and physical activities Evidence for depression or mood disorders  The patient's weight, height, BMI, and visual acuity have been recorded in the chart.  I have made referrals, counseling, and provided education to the patient based on review of the above and I have provided the patient with a written personalized care plan for preventive services.     Jacinto Keil, MD   04/30/2013

## 2013-04-30 NOTE — Patient Instructions (Signed)
Consider getting Prevnar - call the insurance to check on it.   We will refer to Urology for the enlarged prosate

## 2013-05-01 LAB — PSA: PSA: 104.4 ng/mL — ABNORMAL HIGH (ref <=4.00)

## 2013-05-19 DIAGNOSIS — R399 Unspecified symptoms and signs involving the genitourinary system: Secondary | ICD-10-CM | POA: Insufficient documentation

## 2013-05-19 DIAGNOSIS — N4289 Other specified disorders of prostate: Secondary | ICD-10-CM | POA: Insufficient documentation

## 2013-05-24 ENCOUNTER — Other Ambulatory Visit: Payer: Self-pay | Admitting: Family Medicine

## 2013-05-28 ENCOUNTER — Ambulatory Visit (INDEPENDENT_AMBULATORY_CARE_PROVIDER_SITE_OTHER): Payer: Commercial Managed Care - HMO | Admitting: Family Medicine

## 2013-05-28 ENCOUNTER — Encounter: Payer: Self-pay | Admitting: Family Medicine

## 2013-05-28 VITALS — BP 128/72 | HR 68 | Wt 173.0 lb

## 2013-05-28 DIAGNOSIS — I10 Essential (primary) hypertension: Secondary | ICD-10-CM

## 2013-05-28 DIAGNOSIS — E785 Hyperlipidemia, unspecified: Secondary | ICD-10-CM

## 2013-05-28 NOTE — Progress Notes (Signed)
   Subjective:    Patient ID: Derrick Cowan, male    DOB: [birth year], 73 y.o.   MRN: [id number]  HPI Hypertension- followup today. He did start taking a whole tab of lisinopril. He has been splitting it previously. Pt denies chest pain, SOB, dizziness, or heart palpitations.  Taking meds as directed w/o problems.  Denies medication side effects.    Hyperlipidemia- he did increase his pravastatin to a whole tab, he was previously splitting it. toleratin stating well. Now on a whole pill.   Review of Systems     Objective:   Physical Exam  Constitutional: He is oriented to person, place, and time. He appears well-developed and well-nourished.  HENT:  Head: Normocephalic and atraumatic.  Cardiovascular: Normal rate, regular rhythm and normal heart sounds.   Pulmonary/Chest: Effort normal and breath sounds normal.  Neurological: He is alert and oriented to person, place, and time.  Skin: Skin is warm and dry.  Psychiatric: He has a normal mood and affect. His behavior is normal.          Assessment & Plan:  Hypertension-well-controlled. Continue current regimen. Followup in 6 months.  Hyperlipidemia-tolerating whole tab of the statin well. Continue current regimen. Followup in 6 months.

## 2013-06-10 DIAGNOSIS — C61 Malignant neoplasm of prostate: Secondary | ICD-10-CM | POA: Insufficient documentation

## 2013-07-23 ENCOUNTER — Other Ambulatory Visit: Payer: Self-pay | Admitting: Family Medicine

## 2013-09-06 ENCOUNTER — Ambulatory Visit (INDEPENDENT_AMBULATORY_CARE_PROVIDER_SITE_OTHER): Payer: Commercial Managed Care - HMO | Admitting: Family Medicine

## 2013-09-06 DIAGNOSIS — Z23 Encounter for immunization: Secondary | ICD-10-CM

## 2013-11-15 ENCOUNTER — Other Ambulatory Visit: Payer: Self-pay | Admitting: Family Medicine

## 2013-11-29 ENCOUNTER — Encounter: Payer: Self-pay | Admitting: Family Medicine

## 2013-11-29 ENCOUNTER — Ambulatory Visit (INDEPENDENT_AMBULATORY_CARE_PROVIDER_SITE_OTHER): Payer: Commercial Managed Care - HMO | Admitting: Family Medicine

## 2013-11-29 VITALS — BP 122/90 | HR 65 | Wt 182.0 lb

## 2013-11-29 DIAGNOSIS — E8881 Metabolic syndrome: Secondary | ICD-10-CM

## 2013-11-29 DIAGNOSIS — I10 Essential (primary) hypertension: Secondary | ICD-10-CM

## 2013-11-29 DIAGNOSIS — E785 Hyperlipidemia, unspecified: Secondary | ICD-10-CM

## 2013-11-29 DIAGNOSIS — E88819 Insulin resistance, unspecified: Secondary | ICD-10-CM

## 2013-11-29 LAB — POCT GLYCOSYLATED HEMOGLOBIN (HGB A1C): HEMOGLOBIN A1C: 5.7

## 2013-11-29 NOTE — Progress Notes (Signed)
   Subjective:    Patient ID: Derrick Cowan, male    DOB: 03/13/1940, 73 y.o.   MRN: [id number]  HPI Hypertension- Pt denies chest pain, SOB, dizziness, or heart palpitations.  Taking meds as directed w/o problems.  Denies medication side effects.  His wife is here with him for the visit today. He says he has been eating a lot of pretzels at night.  IFG - he has been walking more for exercise. He denies any hypoglycemic events.  Hyperlipidemia-his wife wanted me to review with him foods that are better for patients with high cholesterol. He says he takes a statin regularly without any side effects or myalgias.  Review of Systems     Objective:   Physical Exam  Constitutional: He is oriented to person, place, and time. He appears well-developed and well-nourished.  HENT:  Head: Normocephalic and atraumatic.  Cardiovascular: Normal rate and regular rhythm.   Murmur heard. 3/6 systolic ejection murmur best heard at right sternal border  Pulmonary/Chest: Effort normal and breath sounds normal.  Neurological: He is alert and oriented to person, place, and time.  Skin: Skin is warm and dry.  Psychiatric: He has a normal mood and affect. His behavior is normal.          Assessment & Plan:  HTN-  Blood pressure was quite elevated initially. Repeat blood pressure was better but the diastolic was still mildly elevated. Continue to monitor carefully. He has a follow-up appointment with one of his specialists in about 2 weeks. I encouraged him to call me if his blood pressure is still high at his other doctors office. Otherwise I will see him back in 6 months.  IFG - eliminate A1c is down to 5.7 today which is fantastic. He has been walking regularly for exercise. Suspect this is made a big impact on his A1c. We will check this again in 6 months.  Hyperlipidemia - due to recheck lipids. Has been taking medication consistently. Discusses is very important to help associate with the  calcification he has on his aortic valve.

## 2013-11-29 NOTE — Patient Instructions (Signed)
Please call me with your blood pressure in a couple weeks after you see your specialist. That way we can adjust the medication if needed over the phone.

## 2013-12-02 LAB — LIPID PANEL
CHOL/HDL RATIO: 3.4 ratio
Cholesterol: 144 mg/dL (ref 0–200)
HDL: 42 mg/dL (ref >39)
LDL Cholesterol: 86 mg/dL (ref 0–99)
TRIGLYCERIDES: 80 mg/dL (ref <150)
VLDL: 16 mg/dL (ref 0–40)

## 2013-12-02 LAB — COMPLETE METABOLIC PANEL WITH GFR
ALBUMIN: 3.8 g/dL (ref 3.5–5.2)
ALT: 12 U/L (ref 0–53)
AST: 13 U/L (ref 0–37)
Alkaline Phosphatase: 52 U/L (ref 39–117)
BILIRUBIN TOTAL: 0.5 mg/dL (ref 0.2–1.2)
BUN: 17 mg/dL (ref 6–23)
CO2: 25 mEq/L (ref 19–32)
Calcium: 9.4 mg/dL (ref 8.4–10.5)
Chloride: 106 mEq/L (ref 96–112)
Creat: 1.04 mg/dL (ref 0.50–1.35)
GFR, EST AFRICAN AMERICAN: 82 mL/min
GFR, Est Non African American: 71 mL/min
Glucose, Bld: 115 mg/dL — ABNORMAL HIGH (ref 70–99)
Potassium: 4.3 mEq/L (ref 3.5–5.3)
SODIUM: 142 meq/L (ref 135–145)
TOTAL PROTEIN: 6.4 g/dL (ref 6.0–8.3)

## 2014-01-19 ENCOUNTER — Other Ambulatory Visit: Payer: Self-pay | Admitting: Family Medicine

## 2014-01-31 ENCOUNTER — Ambulatory Visit (INDEPENDENT_AMBULATORY_CARE_PROVIDER_SITE_OTHER): Payer: Commercial Managed Care - HMO | Admitting: Family Medicine

## 2014-01-31 ENCOUNTER — Encounter: Payer: Self-pay | Admitting: Family Medicine

## 2014-01-31 VITALS — BP 148/78 | HR 73 | Ht 68.0 in | Wt 181.0 lb

## 2014-01-31 DIAGNOSIS — I10 Essential (primary) hypertension: Secondary | ICD-10-CM

## 2014-01-31 DIAGNOSIS — E785 Hyperlipidemia, unspecified: Secondary | ICD-10-CM

## 2014-01-31 DIAGNOSIS — Z8673 Personal history of transient ischemic attack (TIA), and cerebral infarction without residual deficits: Secondary | ICD-10-CM

## 2014-01-31 DIAGNOSIS — Z23 Encounter for immunization: Secondary | ICD-10-CM

## 2014-01-31 NOTE — Progress Notes (Signed)
   Subjective:    Patient ID: Derrick Cowan, male    DOB: [birth year], 74 y.o.   MRN: [id number]  HPI Hypertension- Pt denies chest pain, SOB, dizziness, or heart palpitations.  Taking meds as directed w/o problems.  Denies medication side effects.  Hx of stroke so high risk.   Hx of CVA - He is on ASA. Has been walking for exercise. Taking pravastin.  No myalgis or S.E on the medication. No weakness.     Review of Systems     Objective:   Physical Exam  Constitutional: He is oriented to person, place, and time. He appears well-developed and well-nourished.  HENT:  Head: Normocephalic and atraumatic.  Neck: Neck supple. No thyromegaly present.  Cardiovascular: Normal rate, regular rhythm and normal heart sounds.   4/6 SEM harsh, best heard at the let sternal border.   Pulmonary/Chest: Effort normal and breath sounds normal.  Lymphadenopathy:    He has no cervical adenopathy.  Neurological: He is alert and oriented to person, place, and time.  Skin: Skin is warm and dry.  Psychiatric: He has a normal mood and affect. His behavior is normal.          Assessment & Plan:  HTN - Incease HCTZ to 25mg . currently he is taking half a tab daily. Keep appt in April for [facility] exam and can recheck then. Will check BMP at follow-up appointment.  Hx of CVA- doing well on current regimen. 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 No recent repeat symptoms. He does take an aspirin, statin and we are working on trying to keep his blood pressure maximized. It tends to be up and down when he comes in. That he's not always consistent with taking his medications according to his wife. I did ask that he increase his hydrocodone thiazide to whole tab.  Hyperlipidemia-statin well tolerated. Cholesterol at goal. Reviewed recent results from November with him today.  Discussed due for screening colonoscopy.  He is due this year and encouraged him to think about it. He is not quite ready for the referral to be  placed.  Prevnar 13 given today.

## 2014-01-31 NOTE — Patient Instructions (Signed)
Increase HCTZ to 25mg .

## 2014-04-27 ENCOUNTER — Other Ambulatory Visit: Payer: Self-pay | Admitting: *Deleted

## 2014-04-27 DIAGNOSIS — R972 Elevated prostate specific antigen [PSA]: Secondary | ICD-10-CM

## 2014-04-27 DIAGNOSIS — I10 Essential (primary) hypertension: Secondary | ICD-10-CM

## 2014-05-02 ENCOUNTER — Encounter: Payer: Self-pay | Admitting: Family Medicine

## 2014-05-02 ENCOUNTER — Other Ambulatory Visit: Payer: Self-pay

## 2014-05-02 ENCOUNTER — Ambulatory Visit (INDEPENDENT_AMBULATORY_CARE_PROVIDER_SITE_OTHER): Payer: Commercial Managed Care - HMO | Admitting: Family Medicine

## 2014-05-02 VITALS — BP 140/82 | HR 63 | Ht 68.0 in | Wt 184.0 lb

## 2014-05-02 DIAGNOSIS — E8881 Metabolic syndrome: Secondary | ICD-10-CM | POA: Diagnosis not present

## 2014-05-02 DIAGNOSIS — Z0000 Encounter for general adult medical examination without abnormal findings: Secondary | ICD-10-CM | POA: Diagnosis not present

## 2014-05-02 DIAGNOSIS — R972 Elevated prostate specific antigen [PSA]: Secondary | ICD-10-CM

## 2014-05-02 DIAGNOSIS — I10 Essential (primary) hypertension: Secondary | ICD-10-CM

## 2014-05-02 LAB — BASIC METABOLIC PANEL WITH GFR
BUN: 18 mg/dL (ref 6–23)
CALCIUM: 9.6 mg/dL (ref 8.4–10.5)
CO2: 28 mEq/L (ref 19–32)
CREATININE: 0.99 mg/dL (ref 0.50–1.35)
Chloride: 101 mEq/L (ref 96–112)
GFR, EST AFRICAN AMERICAN: 87 mL/min
GFR, Est Non African American: 75 mL/min
Glucose, Bld: 119 mg/dL — ABNORMAL HIGH (ref 70–99)
Potassium: 3.7 mEq/L (ref 3.5–5.3)
SODIUM: 139 meq/L (ref 135–145)

## 2014-05-02 LAB — POCT GLYCOSYLATED HEMOGLOBIN (HGB A1C): Hemoglobin A1C: 6.2

## 2014-05-02 NOTE — Progress Notes (Addendum)
Subjective:    Derrick Cowan is a 74 y.o. male who presents for Medicare Annual/Subsequent preventive examination.   Preventive Screening-Counseling & Management  Tobacco History  Smoking status  . Never Smoker   Smokeless tobacco  . Not on file    Problems Prior to Visit 1.   Current Problems (verified) Patient Active Problem List   Diagnosis Date Noted  . CA of prostate 06/10/2013  . Lower urinary tract symptoms 05/19/2013  . Prostate lump 05/19/2013  . Memory loss 04/30/2013  . Glaucoma 11/09/2012  . Aortic stenosis 03/09/2012  . Overweight(278.02) 03/09/2012  . 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 URINARY FREQUENCY 10/28/2007  . SLEEP APNEA, OBSTRUCTIVE 04/27/2007  . Hyperlipidemia 03/23/2007  . ESSENTIAL HYPERTENSION, BENIGN 03/23/2007  . CVA 03/23/2007    Medications Prior to Visit Current Outpatient Prescriptions on File Prior to Visit  Medication Sig Dispense Refill  . aspirin 81 MG tablet Take 81 mg by mouth daily.      . bimatoprost (LUMIGAN) 0.03 % ophthalmic drops 1 drop daily.      Marland Kitchen CARTIA XT 180 MG 24 hr capsule TAKE 1 CAPSULE EVERY DAY 90 capsule 1  . hydrochlorothiazide (HYDRODIURIL) 25 MG tablet Take 0.5 tablets (12.5 mg total) by mouth daily. (Patient taking differently: Take 25 mg by mouth daily. ) 90 tablet 2  . latanoprost (XALATAN) 0.005 % ophthalmic solution     . lisinopril (PRINIVIL,ZESTRIL) 20 MG tablet TAKE 1 TABLET EVERY DAY 90 tablet 1  . pravastatin (PRAVACHOL) 40 MG tablet TAKE 1/2 TABLET AT BEDTIME 45 tablet 1  . tamsulosin (FLOMAX) 0.4 MG CAPS capsule Take 1 capsule by mouth at bedtime.     No current facility-administered medications on file prior to visit.    Current Medications (verified) Current Outpatient Prescriptions  Medication Sig Dispense Refill  . brimonidine (ALPHAGAN) 0.2 % ophthalmic solution     . aspirin 81 MG tablet Take 81 mg by mouth daily.      . bimatoprost (LUMIGAN) 0.03 % ophthalmic drops 1 drop daily.       Marland Kitchen CARTIA XT 180 MG 24 hr capsule TAKE 1 CAPSULE EVERY DAY 90 capsule 1  . hydrochlorothiazide (HYDRODIURIL) 25 MG tablet Take 0.5 tablets (12.5 mg total) by mouth daily. (Patient taking differently: Take 25 mg by mouth daily. ) 90 tablet 2  . latanoprost (XALATAN) 0.005 % ophthalmic solution     . lisinopril (PRINIVIL,ZESTRIL) 20 MG tablet TAKE 1 TABLET EVERY DAY 90 tablet 1  . pravastatin (PRAVACHOL) 40 MG tablet TAKE 1/2 TABLET AT BEDTIME 45 tablet 1  . tamsulosin (FLOMAX) 0.4 MG CAPS capsule Take 1 capsule by mouth at bedtime.     No current facility-administered medications for this visit.     Allergies (verified) Amlodipine   PAST HISTORY  Family History Family History  Problem Relation Age of Onset  . Heart disease Other     family hx of    Social History History  Substance Use Topics  . Smoking status: Never Smoker   . Smokeless tobacco: Not on file  . Alcohol Use: No    Are there smokers in your home (other than you)?  No  Risk Factors Current exercise habits: plans on starting.  Dietary issues discussed: None  Cardiac risk factors: advanced age (older than 48 for men, 27 for women), hypertension, male gender and sedentary lifestyle.  Depression Screen (Note: if answer to either of the following is "Yes", a more complete depression screening is indicated)  Q1: Over the past two weeks, have you felt down, depressed or hopeless? No  Q2: Over the past two weeks, have you felt little interest or pleasure in doing things? No  Have you lost interest or pleasure in daily life? No  Do you often feel hopeless? No  Do you cry easily over simple problems? No  Activities of Daily Living In your present state of health, do you have any difficulty performing the following activities?:  Driving? No Managing money?  No Feeding yourself? No Getting from bed to chair? No Climbing a flight of stairs? No Preparing food and eating?: No Bathing or showering? No Getting  dressed: No Getting to the toilet? No Using the toilet:No Moving around from place to place: No In the past year have you fallen or had a near fall?:No   Are you sexually active?  No  Do you have more than one partner?  No  Hearing Difficulties: No Do you often ask people to speak up or repeat themselves? No Do you experience ringing or noises in your ears? No Do you have difficulty understanding soft or whispered voices? No   Do you feel that you have a problem with memory? Yes  Do you often misplace items? No  Do you feel safe at home?  Yes  Cognitive Testing  Alert? Yes  Normal Appearance?Yes  Oriented to person? Yes  Place? Yes   Time? Yes  Recall of three objects?  Yes  Can perform simple calculations? Yes  Displays appropriate judgment?Yes  Can read the correct time from a watch face?Yes   6 CIT score of  0/28 which is normal.     Advanced Directives have been discussed with the patient? Yes   List the Names of Other Physician/Practitioners you currently use: 1.    Indicate any recent Medical Services you may have received from other than [facility] providers in the past year (date may be approximate).  Immunization History  Administered Date(s) Administered  . Influenza Split 10/12/2010, 09/30/2011  . Influenza Whole 11/25/2008, 01/01/2010  . Influenza,inj,Quad PF,36+ Mos 11/09/2012, 09/06/2013  . Pneumococcal Conjugate-13 01/31/2014  . Pneumococcal Polysaccharide-23 04/12/2008  . Td 03/23/2007  . Zoster 01/11/2013    Screening Tests Health Maintenance  Topic Date Due  . COLONOSCOPY  01/14/2014  . INFLUENZA VACCINE  08/15/2014  . TETANUS/TDAP  03/22/2017  . ZOSTAVAX  Addressed  . PNA vac Low Risk Adult  Completed    All answers were reviewed with the patient and necessary referrals were made:  Katalena Malveaux, MD   05/02/2014   History reviewed: allergies, current medications, past family history, past medical history, past social history, past  surgical history and problem list  Review of Systems A comprehensive review of systems was negative.    Objective:     Vision by Snellen chart: full eye exam last week.   Blood pressure 189/71, pulse 63, height 5\' 8"  (1.727 m), weight 184 lb (83.462 kg). Body mass index is 27.98 kg/(m^2).  BP 189/71 mmHg  Pulse 63  Ht 5\' 8"  (1.727 m)  Wt 184 lb (83.462 kg)  BMI 27.98 kg/m2 General appearance: alert, cooperative and appears stated age Head: Normocephalic, without obvious abnormality, atraumatic Eyes: conj clear, EOMI, PEERLA Ears: normal TM's and external ear canals both ears Nose: Nares normal. Septum midline. Mucosa normal. No drainage or sinus tenderness. Throat: lips, mucosa, and tongue normal; teeth and gums normal Neck: no adenopathy, no carotid bruit, no JVD, supple, symmetrical, trachea midline and thyroid  not enlarged, symmetric, no tenderness/mass/nodules Back: symmetric, no curvature. ROM normal. No CVA tenderness. Lungs: clear to auscultation bilaterally Chest wall: no tenderness Heart: systolic murmur: late systolic 2/6,   right upper sternum Abdomen: soft, non-tender; bowel sounds normal; no masses,  no organomegaly Rectal: normal tone, normal prostate, no masses or tenderness Extremities: extremities normal, atraumatic, no cyanosis or edema Pulses: 2+ and symmetric Skin: Skin color, texture, turgor normal. No rashes or lesions Lymph nodes: Cervical, supraclavicular, and axillary nodes normal. Neurologic: Alert and oriented X 3, normal strength and tone. Normal symmetric reflexes. Normal coordination and gait     Assessment:     Annual Medicare Wellness Exam       Plan:     During the course of the visit the patient was educated and counseled about appropriate screening and preventive services including:    Colorectal cancer screening - due for 10 yr colonoscopy.    IFG - discussed dx.  F/U in 6 months. Discussed need for dietary modifications.     Vaccines are UTD.   HTN - he didn't take BP meds this Am as he was fasting for labs.   Diet review for nutrition referral? Yes ____  Not Indicated __X_   Patient Instructions (the written plan) was given to the patient.  Medicare Attestation I have personally reviewed: The patient's medical and social history Their use of alcohol, tobacco or illicit drugs Their current medications and supplements The patient's functional ability including ADLs,fall risks, home safety risks, cognitive, and hearing and visual impairment Diet and physical activities Evidence for depression or mood disorders  The patient's weight, height, BMI, and visual acuity have been recorded in the chart.  I have made referrals, counseling, and provided education to the patient based on review of the above and I have provided the patient with a written personalized care plan for preventive services.     Ferry Matthis, MD   05/02/2014

## 2014-05-02 NOTE — Patient Instructions (Signed)
Keep up a regular exercise program and make sure you are eating a healthy diet Try to eat 4 servings of dairy a day, or if you are lactose intolerant take a calcium with vitamin D daily.  Your vaccines are up to date.   

## 2014-05-03 LAB — PSA: PSA: 0.22 ng/mL (ref <=4.00)

## 2014-05-07 ENCOUNTER — Other Ambulatory Visit: Payer: Self-pay | Admitting: Family Medicine

## 2014-05-26 ENCOUNTER — Telehealth: Payer: Self-pay | Admitting: Family Medicine

## 2014-05-26 DIAGNOSIS — C61 Malignant neoplasm of prostate: Secondary | ICD-10-CM

## 2014-05-26 NOTE — Telephone Encounter (Signed)
Please send referral and labs ASAP. Thank you

## 2014-05-26 NOTE — Telephone Encounter (Signed)
Left message with Dr. Theda Sers nurse (Angie) to see if they just need the latest labs on file or if new labs need to be ordered prior to referral completion. Will go ahead and fax over latest labs and referral order.

## 2014-05-26 NOTE — Telephone Encounter (Signed)
Derrick Cowan returned clinic call, unsure as to why a new referral had to be placed since Pt is already established there. Advised I had went ahead and placed a new one so she should be expecting it. States she needs nothing further, verbalized understanding.

## 2014-05-26 NOTE — Telephone Encounter (Signed)
Derrick Cowan w/ [facility] called adv they need a referral for prostate cancer with labs and office notes. He saw Dr. Alverda Skeans and she also adv the referral needs to be dated for today 05/26/14 (Fax# [phone]). Thanks

## 2014-05-27 ENCOUNTER — Telehealth: Payer: Self-pay | Admitting: Family Medicine

## 2014-07-11 ENCOUNTER — Other Ambulatory Visit: Payer: Self-pay | Admitting: Family Medicine

## 2014-09-22 ENCOUNTER — Other Ambulatory Visit: Payer: Self-pay | Admitting: Family Medicine

## 2014-11-01 ENCOUNTER — Ambulatory Visit: Payer: Commercial Managed Care - HMO | Admitting: Family Medicine

## 2014-12-13 ENCOUNTER — Other Ambulatory Visit: Payer: Self-pay | Admitting: Family Medicine

## 2017-09-14 DEATH — deceased
